# Patient Record
Sex: Male | Born: 1980 | Hispanic: Yes | Marital: Single | Smoking: Former smoker
Health system: Southern US, Community
[De-identification: ages and names within clinical notes are randomized; demographics above are authoritative.]

## PROBLEM LIST (undated history)

## (undated) DIAGNOSIS — K219 Gastro-esophageal reflux disease without esophagitis: Secondary | ICD-10-CM

---

## 2016-12-29 NOTE — Congregational Nurse Program (Signed)
Congregational Nurse Program Note  Date of Encounter: 12/29/2016  Past Medical History: No past medical history on file.  Encounter Details:     CNP Questionnaire - 12/29/16 1425      Patient Demographics   Is this a new or existing patient? New   Patient is considered a/an Immigrant   Race Latino/Hispanic     Patient Assistance   Location of Patient Assistance [facility] [employer], [place]   Patient's financial/insurance status Low Income;Self-Pay (Uninsured)   Uninsured Patient ([place] Card/[employer] Connects) Yes   Interventions Counseled to make appt. with provider;Assisted patient in making appt.   Patient referred to apply for the following financial assistance Not Applicable   Food insecurities addressed Not Applicable   Transportation assistance No   Assistance securing medications No   Educational health offerings Navigating the healthcare system;Chronic disease     Encounter Details   Primary purpose of visit Chronic Illness/Condition Visit;Navigating the Healthcare System   Was an Emergency Department visit averted? Yes   Does patient have a medical provider? No   Patient referred to Clinic;Establish PCP   Was a mental health screening completed? (GAINS tool) No   Does patient have dental issues? No   Does patient have vision issues? No   Does your patient have an abnormal blood pressure today? No   Since previous encounter, have you referred patient for abnormal blood pressure that resulted in a new diagnosis or medication change? No   Does your patient have an abnormal blood glucose today? No   Since previous encounter, have you referred patient for abnormal blood glucose that resulted in a new diagnosis or medication change? No   Was there a life-saving intervention made? No     New Client to [facility]. Client is seeking assistance and referral to a primary care provider. He states he has not seen a provider 4 months and that was a clinic in [place] that he sought  medications for complaint of ringing in his ears and constipation. Client reports he was given Linzess for his constipation and Amoxicillin 500 mg for treatment of an ear infection. Today client states he was seen 4 years ago at [facility] for abdominal pain that begins in left lower quadrant and radiates to his back. He states that at that time they performed x-rays only and he was told he had a bacterial infection in his stomach and his intestines had "pockets that can become inflammed". He was referred to an MD in [place]. , but he never made that appointment. Since then he states he has constant abdominal bloating and fullness and chronic pain of his left lower quadrant and associated chronic constipation .( last bowel movement last PM , but was hard balls and not soft) He states this is his normal for past 4 years. He states he has to eat small amounts of food to avoid pain. He eats NO fruits and NO vegetables . He reports he drinks very little water and drinks coffee in the am and coca cola during the day. Client is also complaining about ringing in both ears even after antibiotics he took 4 months ago. He denies any history of wax impaction.   No Know Drug allergies No past surgeries per client Assessment and interview assisted by Eula Flax Spanish Medical interpreter.  Client referred into the free clinic and referral made and appointment secured for 01/02/17 at 0830 Will follow as needed. Discussed with client the importance of smoking cessation as well as  decreasing coca cola intake and carbonated beverages.

## 2017-01-02 ENCOUNTER — Ambulatory Visit: Payer: Self-pay | Admitting: Physician Assistant

## 2017-01-02 ENCOUNTER — Encounter: Payer: Self-pay | Admitting: Physician Assistant

## 2017-01-02 VITALS — BP 104/72 | HR 82 | Temp 97.9°F | Ht 66.25 in | Wt 171.0 lb

## 2017-01-02 DIAGNOSIS — Z13220 Encounter for screening for lipoid disorders: Secondary | ICD-10-CM

## 2017-01-02 DIAGNOSIS — Z131 Encounter for screening for diabetes mellitus: Secondary | ICD-10-CM

## 2017-01-02 DIAGNOSIS — R1012 Left upper quadrant pain: Secondary | ICD-10-CM

## 2017-01-02 DIAGNOSIS — F17210 Nicotine dependence, cigarettes, uncomplicated: Secondary | ICD-10-CM

## 2017-01-02 LAB — CBC WITH DIFFERENTIAL/PLATELET
BASOS PCT: 1 %
Basophils Absolute: 85 cells/uL (ref 0–200)
EOS PCT: 8 %
Eosinophils Absolute: 680 cells/uL — ABNORMAL HIGH (ref 15–500)
HCT: 46.1 % (ref 38.5–50.0)
Hemoglobin: 15.8 g/dL (ref 13.2–17.1)
LYMPHS PCT: 31 %
Lymphs Abs: 2635 cells/uL (ref 850–3900)
MCH: 32.6 pg (ref 27.0–33.0)
MCHC: 34.3 g/dL (ref 32.0–36.0)
MCV: 95.1 fL (ref 80.0–100.0)
MONO ABS: 935 {cells}/uL (ref 200–950)
MONOS PCT: 11 %
MPV: 10.5 fL (ref 7.5–12.5)
Neutro Abs: 4165 cells/uL (ref 1500–7800)
Neutrophils Relative %: 49 %
PLATELETS: 247 10*3/uL (ref 140–400)
RBC: 4.85 MIL/uL (ref 4.20–5.80)
RDW: 14.2 % (ref 11.0–15.0)
WBC: 8.5 10*3/uL (ref 3.8–10.8)

## 2017-01-02 LAB — GLUCOSE, POCT (MANUAL RESULT ENTRY): POC Glucose: 98 mg/dl (ref 70–99)

## 2017-01-02 LAB — HEMOGLOBIN A1C
Hgb A1c MFr Bld: 5.4 % (ref <5.7)
Mean Plasma Glucose: 108 mg/dL

## 2017-01-02 MED ORDER — PANTOPRAZOLE SODIUM 40 MG PO TBEC: DELAYED_RELEASE_TABLET | ORAL | 0 refills | Status: DC

## 2017-01-02 NOTE — Progress Notes (Signed)
BP 104/72 (BP Location: Left Arm, Patient Position: Sitting, Cuff Size: Normal)   Pulse 82   Temp 97.9 F (36.6 C)   Ht 5' 6.25" (1.683 m)   Wt 171 lb (77.6 kg)   SpO2 99%   BMI 27.39 kg/m    Subjective:    Patient ID: Arthur Walker, male    DOB: 06/18/1981, 36 y.o.   MRN: [id number]  HPI: Arthur Walker is a 36 y.o. male presenting on 01/02/2017 for New Patient (Initial Visit) (pt went to hospital 4 years ago pt was told he had an infection. and Spots on is intestine. pt states he has been having pain and constipation. pt last seen 4 months ago with the "[place]inese doctor" in [place].)   HPI   Chief Complaint  Patient presents with  . New Patient (Initial Visit)    pt went to hospital 4 years ago pt was told he had an infection. and Spots on is intestine. pt states he has been having pain and constipation. pt last seen 4 months ago with the "[place] doctor" in [place].   Stomach problems for 4 years.  Possibly getting worse.  It hurts all the time.  Eats makes it worse.  Pain on the LUQ.   Reports some constipation.  No diarrhea.  Only 2 times had blood and then just a little bit on TP when he was constipated.    Pt states [facility] had CT scan about 4 years ago.  Denies colonoscopy.     Relevant past medical, surgical, family and social history reviewed and updated as indicated. Interim medical history since our last visit reviewed. Allergies and medications reviewed and updated.  No current outpatient prescriptions on file.  Review of Systems  Constitutional: Positive for appetite change, fatigue and unexpected weight change. Negative for chills, diaphoresis and fever.  HENT: Positive for dental problem, ear pain, mouth sores and trouble swallowing. Negative for congestion, drooling, facial swelling, hearing loss, sneezing, sore throat and voice change.   Eyes: Negative for pain, discharge, redness, itching and visual disturbance.  Respiratory: Negative for cough,  choking, shortness of breath and wheezing.   Cardiovascular: Positive for leg swelling. Negative for chest pain and palpitations.  Gastrointestinal: Positive for constipation. Negative for abdominal pain, blood in stool, diarrhea and vomiting.  Endocrine: Negative for cold intolerance, heat intolerance and polydipsia.  Genitourinary: Negative for decreased urine volume, dysuria and hematuria.  Musculoskeletal: Positive for arthralgias and back pain. Negative for gait problem.  Skin: Negative for rash.  Allergic/Immunologic: Negative for environmental allergies.  Neurological: Positive for headaches. Negative for seizures, syncope and light-headedness.  Hematological: Negative for adenopathy.  Psychiatric/Behavioral: Negative for agitation, dysphoric mood and suicidal ideas. The patient is not nervous/anxious.     Per HPI unless specifically indicated above     Objective:    BP 104/72 (BP Location: Left Arm, Patient Position: Sitting, Cuff Size: Normal)   Pulse 82   Temp 97.9 F (36.6 C)   Ht 5' 6.25" (1.683 m)   Wt 171 lb (77.6 kg)   SpO2 99%   BMI 27.39 kg/m   Wt Readings from Last 3 Encounters:  01/02/17 171 lb (77.6 kg)    Physical Exam  Constitutional: He is oriented to person, place, and time. He appears well-developed and well-nourished.  HENT:  Head: Normocephalic and atraumatic.  Right Ear: Tympanic membrane, external ear and ear canal normal.  Left Ear: Tympanic membrane, external ear and ear canal normal.  Mouth/Throat: Oropharynx  is clear and moist. No oropharyngeal exudate.  Eyes: Conjunctivae and EOM are normal. Pupils are equal, round, and reactive to light.  Neck: Neck supple. No thyromegaly present.  Cardiovascular: Normal rate and regular rhythm.   Pulmonary/Chest: Effort normal and breath sounds normal. He has no wheezes. He has no rales.  Abdominal: Soft. Bowel sounds are normal. He exhibits no distension, no fluid wave, no ascites and no mass. There is no  hepatosplenomegaly. There is tenderness in the right upper quadrant. There is no rigidity, no rebound, no guarding and no CVA tenderness.  Musculoskeletal: He exhibits no edema.  Lymphadenopathy:    He has no cervical adenopathy.  Neurological: He is alert and oriented to person, place, and time.  Skin: Skin is warm and dry. No rash noted.  Psychiatric: He has a normal mood and affect. His behavior is normal. Thought content normal.  Vitals reviewed.   Results for orders placed or performed in visit on 01/02/17  POCT Glucose (CBG)  Result Value Ref Range   POC Glucose 98 70 - 99 mg/dl      Assessment & Plan:   Encounter Diagnoses  Name Primary?  . Left upper quadrant pain Yes  . Screening for diabetes mellitus (DM)   . Screening cholesterol level   . Cigarette nicotine dependence without complication     -pt encouraged to increase Water and fiber for constipation -Gave handout on constipation -counseled pt to decrease or stop altogether alcohol consumption -pt to Get baseline labs  -Record request sent to [facility] for imaging done 4 years ago -F/u 1 month.  RTO sooner for worsening or new symptoms

## 2017-01-02 NOTE — Patient Instructions (Signed)
Estreimiento - Adultos  (Constipation, Adult)  Estreimiento significa que una persona tiene menos de tres evacuaciones en una semana, dificultad para defecar, o que las heces son secas, duras, o ms grandes que lo normal. A medida que envejecemos el estreimiento es ms comn. Una dieta baja en fibra, no tomar suficientes lquidos y el uso de ciertos medicamentos pueden empeorar el estreimiento.   CAUSAS    Ciertos medicamentos, como los antidepresivos, analgsicos, suplementos de hierro, anticidos y diurticos.   Algunas enfermedades, como la diabetes, el sndrome del colon irritable, enfermedad de la tiroides, o depresin.   No beber suficiente agua.   No consumir suficientes alimentos ricos en fibra.   Situaciones de estrs o viajes.   Falta de actividad fsica o de ejercicio.   Ignorar la necesidad sbita de defecar.   Uso en exceso de laxantes.  SIGNOS Y SNTOMAS    Defecar menos de tres veces por semana.   Dificultad para defecar.   Tener las heces secas y duras, o ms grandes que las normales.   Sensacin de estar lleno o hinchado.   Dolor en la parte baja del abdomen.   No sentir alivio despus de defecar.  DIAGNSTICO   El mdico le har una historia clnica y un examen fsico. Pueden hacerle exmenes adicionales para el estreimiento grave. Estos estudios pueden ser:   Un radiografa con enema de bario para examinar el recto, el colon y, en algunos casos, el intestino delgado.   Una sigmoidoscopia para examinar el colon inferior.   Una colonoscopia para examinar todo el colon.  TRATAMIENTO   El tratamiento depender de la gravedad del estreimiento y de la causa. Algunos tratamientos nutricionales son beber ms lquidos y comer ms alimentos ricos en fibra. El cambio en el estilo de vida incluye hacer ejercicios de manera regular. Si estas recomendaciones para realizar cambios en la dieta y en el estilo de vida no ayudan, el mdico le puede indicar el uso de laxantes de venta libre  para ayudarlo a defecar. Los medicamentos recetados se pueden prescribir si los medicamentos de venta libre no lo ayudan.   INSTRUCCIONES PARA EL CUIDADO EN EL HOGAR    Consuma alimentos con alto contenido de fibra, como frutas, vegetales, cereales integrales y porotos.   Limite los alimentos procesados ricos en grasas y azcar, como las papas fritas, hamburguesas, galletas, dulces y refrescos.   Puede agregar un suplemento de fibra a su dieta si no obtiene lo suficiente de los alimentos.   Beba suficiente lquido para mantener la orina clara o de color amarillo plido.   Haga ejercicio regularmente o segn las indicaciones del mdico.   Vaya al bao cuando sienta la necesidad de ir. No se aguante las ganas.   Tome solo medicamentos de venta libre o recetados, segn las indicaciones del mdico. No tome otros medicamentos para el estreimiento sin consultarlo antes con su mdico.  SOLICITE ATENCIN MDICA DE INMEDIATO SI:    Observa sangre brillante en las heces.   El estreimiento dura ms de 4 das o empeora.   Siente dolor abdominal o rectal.   Las heces son delgadas como un lpiz.   Pierde peso de manera inexplicable.  ASEGRESE DE QUE:    Comprende estas instrucciones.   Controlar su afeccin.   Recibir ayuda de inmediato si no mejora o si empeora.     Esta informacin no tiene como fin reemplazar el consejo del mdico. Asegrese de hacerle al mdico cualquier pregunta que tenga.       Document Released: 11/27/2007 Document Revised: 11/28/2014  Elsevier Interactive Patient Education 2017 Elsevier Inc.

## 2017-01-03 LAB — LIPID PANEL
CHOLESTEROL: 277 mg/dL — AB (ref <200)
HDL: 40 mg/dL — AB (ref >40)
LDL CALC: 166 mg/dL — AB (ref <100)
TRIGLYCERIDES: 353 mg/dL — AB (ref <150)
Total CHOL/HDL Ratio: 6.9 Ratio — ABNORMAL HIGH (ref <5.0)
VLDL: 71 mg/dL — AB (ref <30)

## 2017-01-03 LAB — COMPREHENSIVE METABOLIC PANEL
ALK PHOS: 66 U/L (ref 40–115)
ALT: 22 U/L (ref 9–46)
AST: 19 U/L (ref 10–40)
Albumin: 4.2 g/dL (ref 3.6–5.1)
BILIRUBIN TOTAL: 0.4 mg/dL (ref 0.2–1.2)
BUN: 21 mg/dL (ref 7–25)
CO2: 25 mmol/L (ref 20–31)
Calcium: 9.3 mg/dL (ref 8.6–10.3)
Chloride: 107 mmol/L (ref 98–110)
Creat: 0.98 mg/dL (ref 0.60–1.35)
GLUCOSE: 100 mg/dL — AB (ref 65–99)
Potassium: 5.6 mmol/L — ABNORMAL HIGH (ref 3.5–5.3)
Sodium: 139 mmol/L (ref 135–146)
Total Protein: 7.7 g/dL (ref 6.1–8.1)

## 2017-01-03 LAB — AMYLASE: AMYLASE: 36 U/L (ref 0–105)

## 2017-01-03 LAB — H. PYLORI BREATH TEST: H. pylori Breath Test: DETECTED — AB

## 2017-01-03 LAB — LIPASE: Lipase: 43 U/L (ref 7–60)

## 2017-01-09 ENCOUNTER — Other Ambulatory Visit: Payer: Self-pay | Admitting: Physician Assistant

## 2017-01-09 DIAGNOSIS — E875 Hyperkalemia: Secondary | ICD-10-CM

## 2017-01-11 LAB — POTASSIUM: POTASSIUM: 4.3 mmol/L (ref 3.5–5.3)

## 2017-01-12 ENCOUNTER — Telehealth: Payer: Self-pay | Admitting: Student

## 2017-01-12 NOTE — Telephone Encounter (Signed)
Called pt to notify of lab results and while on the phone pt c/o bone aches, HA, ear pain,  and feeling like he has a cold in the mornings. Pt states S/sx began after starting on pantoprazole 01-02-17. Pt states he is unsure if sx are due to taking pantoprazole. Pt states taking advil for HA.  PA advises that she believes it may be a cold, but pt may d/c pantoprazole for the weekend if he believes pantoprazole is cause of sx. Pt is to restart pantoprazole after the weekend is over. Pt is to f/u on 01-30-17.  Pt notified and pt verbalized understanding.

## 2017-01-30 ENCOUNTER — Encounter: Payer: Self-pay | Admitting: Physician Assistant

## 2017-01-30 ENCOUNTER — Ambulatory Visit: Payer: Self-pay | Admitting: Physician Assistant

## 2017-01-30 VITALS — BP 120/84 | HR 71 | Temp 97.5°F | Ht 66.25 in | Wt 169.5 lb

## 2017-01-30 DIAGNOSIS — R911 Solitary pulmonary nodule: Secondary | ICD-10-CM

## 2017-01-30 DIAGNOSIS — K219 Gastro-esophageal reflux disease without esophagitis: Secondary | ICD-10-CM

## 2017-01-30 DIAGNOSIS — E785 Hyperlipidemia, unspecified: Secondary | ICD-10-CM

## 2017-01-30 DIAGNOSIS — F17210 Nicotine dependence, cigarettes, uncomplicated: Secondary | ICD-10-CM

## 2017-01-30 DIAGNOSIS — A048 Other specified bacterial intestinal infections: Secondary | ICD-10-CM

## 2017-01-30 MED ORDER — AMOXICILLIN 500 MG PO CAPS: ORAL_CAPSULE | ORAL | 0 refills | Status: DC

## 2017-01-30 MED ORDER — CLARITHROMYCIN 500 MG PO TABS: ORAL_TABLET | ORAL | 0 refills | Status: AC

## 2017-01-30 MED ORDER — PANTOPRAZOLE SODIUM 40 MG PO TBEC: DELAYED_RELEASE_TABLET | ORAL | 0 refills | Status: DC

## 2017-01-30 MED ORDER — LOVASTATIN 20 MG PO TABS: ORAL_TABLET | ORAL | 3 refills | Status: DC

## 2017-01-30 NOTE — Progress Notes (Signed)
BP 120/84 (BP Location: Left Arm, Patient Position: Sitting, Cuff Size: Normal)   Pulse 71   Temp 97.5 F (36.4 C) (Other (Comment))   Ht 5' 6.25" (1.683 m)   Wt 169 lb 8 oz (76.9 kg)   SpO2 98%   BMI 27.15 kg/m    Subjective:    Patient ID: Arthur Walker, male    DOB: 29-Jan-1981, 36 y.o.   MRN: [id number]  HPI: Arthur Walker is a 36 y.o. male presenting on 01/30/2017 for Abdominal Pain   HPI   Pt states constipation improved with metamucil  States abd pain may be improved some but still present  Relevant past medical, surgical, family and social history reviewed and updated as indicated. Interim medical history since our last visit reviewed. Allergies and medications reviewed and updated.   Current Outpatient Prescriptions:  .  pantoprazole (PROTONIX) 40 MG tablet, 1 po bid.  Tome una tableta por boca dos veces diarias, Disp: 60 tablet, Rfl: 0   Review of Systems  Constitutional: Positive for appetite change and fatigue. Negative for chills, diaphoresis, fever and unexpected weight change.  HENT: Positive for mouth sores, sore throat and trouble swallowing. Negative for congestion, drooling, ear pain, facial swelling, hearing loss, sneezing and voice change.   Eyes: Negative for pain, discharge, redness, itching and visual disturbance.  Respiratory: Negative for cough, choking, shortness of breath and wheezing.   Cardiovascular: Negative for chest pain, palpitations and leg swelling.  Gastrointestinal: Positive for abdominal pain. Negative for blood in stool, constipation, diarrhea and vomiting.  Endocrine: Negative for cold intolerance, heat intolerance and polydipsia.  Genitourinary: Negative for decreased urine volume, dysuria and hematuria.  Musculoskeletal: Negative for arthralgias, back pain and gait problem.  Skin: Negative for rash.  Allergic/Immunologic: Negative for environmental allergies.  Neurological: Negative for seizures, syncope,  light-headedness and headaches.  Hematological: Negative for adenopathy.  Psychiatric/Behavioral: Negative for agitation, dysphoric mood and suicidal ideas. The patient is not nervous/anxious.     Per HPI unless specifically indicated above     Objective:    BP 120/84 (BP Location: Left Arm, Patient Position: Sitting, Cuff Size: Normal)   Pulse 71   Temp 97.5 F (36.4 C) (Other (Comment))   Ht 5' 6.25" (1.683 m)   Wt 169 lb 8 oz (76.9 kg)   SpO2 98%   BMI 27.15 kg/m   Wt Readings from Last 3 Encounters:  01/30/17 169 lb 8 oz (76.9 kg)  01/02/17 171 lb (77.6 kg)    Physical Exam  Constitutional: He is oriented to person, place, and time. He appears well-developed and well-nourished.  HENT:  Head: Normocephalic and atraumatic.  Neck: Neck supple.  Cardiovascular: Normal rate and regular rhythm.   Pulmonary/Chest: Effort normal and breath sounds normal. He has no wheezes.  Abdominal: Soft. Bowel sounds are normal. There is no hepatosplenomegaly. There is no tenderness.  Musculoskeletal: He exhibits no edema.  Lymphadenopathy:    He has no cervical adenopathy.  Neurological: He is alert and oriented to person, place, and time.  Skin: Skin is warm and dry.  Psychiatric: He has a normal mood and affect. His behavior is normal.  Vitals reviewed.   Results for orders placed or performed in visit on 01/09/17  Potassium  Result Value Ref Range   Potassium 4.3 3.5 - 5.3 mmol/L      Assessment & Plan:   Encounter Diagnoses  Name Primary?  . H. pylori infection Yes  . Gastroesophageal reflux disease, esophagitis presence  not specified   . Hyperlipidemia, unspecified hyperlipidemia type   . Cigarette nicotine dependence without complication   . Lung nodule seen on imaging study     -reviewed labs with pt -will order CT lung (due to nodule on CT 04/16/2011) -gave [facility] discount application -will treat h pylori -will treat hyperlipidemia with low-fat diet and  lovastatin -pt counseled to - do not start lovastatin until finished with h pylori medications -COUNSELED SMOKING CESSATION -f/u 1 month.  RTO sooner prn worsening or new symptoms

## 2017-01-30 NOTE — Patient Instructions (Addendum)
DO NOT START THE LOVASTATIN (MEDICATION FOR YOUR CHOLESTEROL) UNTIL YOU ARE FINISHED WITH THE H PYLORI MEDICATIONS    NO EMPIEZE A TOMAR EL LOVASTATIN (EL MEDICAMENTO PARA EL COLESTEROL) HASTA QUE HAGA ACABADO LAS MEDICINAS PARA EL h PYLORI (INFECCION EN EL ESTOMAGO)   Dieta restringida en grasas y colesterol (Fat and Cholesterol Restricted Diet) Los niveles altos de grasa y Oncologistcolesterol en la sangre pueden causar varios problemas de salud, como enfermedades del corazn, de los vasos sanguneos, de la Valmontvescula, del hgado y del pncreas. Las grasas son fuentes de Engineer, drillingenerga concentrada que existen en varias formas. Consumir en exceso ciertos tipos de grasa, incluidas las grasas saturadas, puede ser perjudicial. El colesterol es una sustancia que el organismo necesita en pequeas cantidades. El cuerpo Cocos (Ke[place]) Islandsfabrica todo el colesterol que necesita. El exceso de colesterol proviene de los alimentos que come. Si sus niveles de colesterol y grasas saturadas en la sangre son elevados, puede tener problemas de salud, dado que el exceso de grasa y Oncologistcolesterol se acumula en las paredes de los vasos sanguneos, provocando su Scientist, research (medical)estrechamiento. Elegir los Public house manageralimentos apropiados le permitir controlar su ingesta de grasa y Wilbur Parkcolesterol. Esto le ayudar a Calpine Corporationmantener los niveles de estas sustancias en la sangre dentro de los lmites normales y a reducir el riesgo de Biochemist, clinicalcontraer enfermedades. EN QU CONSISTE EL PLAN? El mdico le recomienda que:  Limite la ingesta de grasas a alrededor del _______% del total de caloras por da.  Limite la cantidad de colesterol en su dieta a menos de _________mg Karie Chimerapor da.  Consuma entre 20 y 30gramos de Rohm and Haasfibra todos los das. QU TIPOS DE GRASAS DEBO ELEGIR?  Elija grasas saludables con mayor frecuencia. Elija las grasas monoinsaturadas y [address]poliinsaturadas, como el aceite de oliva y canola, las semillas de Marshallvillelino, las nueces, las almendras y las semillas.  Consuma ms grasas omega-3. Las mejores  opciones incluyen salmn, caballa, sardinas, atn, aceite de lino y semillas de lino molidas. Trate de consumir pescado al Borders Groupmenos dos veces por semana.  Limite el consumo de grasas saturadas. Estas se encuentran principalmente en los productos de origen animal, como las carnes, la Oz[place] Acresmantequilla y la crema. Las grasas saturadas de origen vegetal incluyen aceite de palma, de palmiste y de coco.  Evite los alimentos con aceites parcialmente hidrogenados. Estos contienen grasas trans. Entre los ejemplos de alimentos con grasas trans se incluyen margarinas en barra, algunas margarinas untables, galletas dulces o saladas y otros productos horneados. QU PAUTAS GENERALES DEBO SEGUIR? Estas pautas para una alimentacin saludable le ayudarn a controlar su ingesta de grasa y colesterol:  Lea las etiquetas de los alimentos detenidamente para identificar los que contienen grasas trans o altas cantidades de grasas saturadas.  Llene la mitad del plato con verduras y ensaladas de hojas verdes.  Llene un cuarto del plato con cereales integrales. Busque la palabra "integral" en Estate agentel primer lugar de la lista de ingredientes.  Llene un cuarto del plato con alimentos con protenas magras.  Limite las frutas a dos porciones por da. Elija frutas en lugar de jugos.  Coma ms alimentos que contienen fibra, como River Roadmanzanas, Menomoniebrcoli, Ellentonzanahorias, frijoles, guisantes y Qatarcebada.  Consuma ms comida casera y menos de restaurante, de buf y comida rpida.  Limite o evite el alcohol.  Limite los alimentos con alto contenido de almidn y International aid/development workerazcar.  Limite el consumo de alimentos fritos.  Cocine los alimentos utilizando mtodos que no sean la fritura. Las opciones de coccin ms Panamaadecuadas son Development worker, communityhornear, Regulatory affairs officerhervir, Software engineergrillar y Transport plannerasar  a la parrilla.  Baje de peso si es necesario. Perder solo del 5 al 10% de su peso inicial puede ayudarle a mejorar su estado de salud general y a Education officer, museum, como la diabetes y las enfermedades  cardacas. QU ALIMENTOS PUEDO COMER? Cereales Cereales integrales, como los panes de salvado o [place], las [place], los cereales y las pastas. Avena sin endulzar, trigo, [place], quinua o arroz integral. Tortillas de harina de maz o de salvado. Verduras Verduras frescas o congeladas (crudas, al vapor, asadas o grilladas). Ensaladas de hojas verdes. Nils Pyle Nils Pyle frescas, en conserva (en su jugo natural) o frutas congeladas. Carnes y otros productos con protenas Carne de res molida (al 85% o ms [place]), carne de res de animales alimentados con pastos o carne de res sin la grasa. Pollo o pavo sin piel. Carne de pollo o de [place]. Cerdo sin la grasa. Todos los pescados y frutos de mar. Huevos. Porotos, guisantes o lentejas secos. Frutos secos o semillas sin sal. Frijoles secos o en lata sin sal. Lcteos Productos lcteos con bajo contenido de grasas, como [place] o al 1%, quesos reducidos en grasas o al 2%, ricota con bajo contenido de grasas o [employer], o yogur natural con bajo contenido de [place]. Grasas y Hers[employer] Company untables que no contengan grasas trans. Mayonesa y condimentos para ensaladas livianos o reducidos en grasas. [place]. Aceites de oliva, canola, ssamo o crtamo. Mantequilla natural de cacahuate o almendra (elija la que no tenga agregado de aceite o azcar). Los artculos mencionados arriba pueden no ser Raytheon de las bebidas o los alimentos recomendados. Comunquese con el nutricionista para conocer ms opciones. QU ALIMENTOS NO SE RECOMIENDAN? Cereales Pan blanco. Pastas blancas. Arroz blanco. Pan de maz. Bagels, pasteles y croissants. Galletas saladas que contengan grasas trans. Verduras Papas blancas. MazHoover Brunette con crema o fritas. Verduras en salsa de [place]. Nils Pyle Frutas secas. Fruta enlatada en almbar liviano o espeso. Jugo de frutas. Carnes y otros productos con protenas Cortes de carne con Holiday representative. Costillas, alas  de pollo, tocineta, salchicha, mortadela, salame, chinchulines, tocino, perros calientes, salchichas alemanas y embutidos envasados. Hgado y otros rganos. Lcteos Leche entera o al 2%, crema, mezcla de [place] y crema, y queso crema. Quesos enteros. Yogur entero o endulzado. Quesos con toda su grasa. Cremas no lcteas y coberturas batidas. Quesos procesados, quesos para untar o cuajadas. Dulces y postres Jarabe de maz, azcares, miel y Radio broadcast assistant. Caramelos. Mermelada y [place]. Doreen Beam. Cereales endulzados. Galletas, pasteles, bizcochuelos, donas, muffins y helado. Grasas y [address], [place] en barra, [place] de [place], [place], [place] clarificada o grasa de tocino. Aceites de coco, de palmiste o de palma. Bebidas Alcohol. Bebidas endulzadas (como refrescos, limonadas y bebidas frutales o ponches). Los artculos mencionados arriba pueden no ser Raytheon de las bebidas y los alimentos que se Theatre stage manager. Comunquese con el nutricionista para obtener ms informacin. Esta informacin no tiene Theme park manager el consejo del mdico. Asegrese de hacerle al mdico cualquier pregunta que tenga. Document Released: 11/07/2005 Document Revised: 11/28/2014 Document Reviewed: 02/05/2014 Elsevier Interactive Patient Education  2017 Elsevier Inc.       Infeccin por Helicobacter Pylori (Helicobacter Pylori Infection) La infeccin por Helicobacter pylori es una infeccin en el estmago que es causada por la bacteria Helicobacter pylori (H. pylori). Este tipo de bacteria vive frecuentemente en el revestimiento del estmago. La infeccin puede causar lceras e irritacin (gastritis) en algunas personas. Es la causa ms comn de Radio producer  estmago (lcera gstrica) y en la parte superior del intestino (lcera duodenal). Tener esta infeccin tambin puede aumentar el riesgo de cncer de Teaching laboratory technician y un tipo de cncer de los glbulos blancos (linfoma) que afecta al  Show [place]. CAUSAS H. pylori es un tipo de bacteria que se encuentra frecuentemente en el estmago de las personas saludables. La bacteria puede pasar de Neomia Dear persona a otra por contacto a travs de las heces o la saliva. No se sabe por qu algunas personas desarrollan lceras, gastritis o cncer a partir de la infeccin. FACTORES DE RIESGO Es ms probable que esta afeccin se manifieste en las personas que:  Tienen familiares con esta infeccin.  Viven con Lucent Technologies; por ejemplo, en un dormitorio.  Son de origen africano, hispano o asitico. SNTOMAS La mayora de las personas con esta infeccin no tienen sntomas. Si tiene sntomas, estos pueden incluir los siguientes:  Merchant navy officer.  Dolor de [place].  Nuseas.  Vmitos.  Sangre en el vmito.  Prdida del apetito.  Mal aliento. DIAGNSTICO Esta afeccin se puede diagnosticar en funcin de los sntomas, un examen fsico y Futures trader. Podrn solicitarle otros estudios, por ejemplo:  Anlisis de sangre o pruebas de materia fecal para verificar las protenas (anticuerpos) que el cuerpo puede producir en respuesta a las bacterias. Estas pruebas son la mejor manera de confirmar el diagnstico.  Neomia Dear prueba de aliento para verificar el tipo de gas que la bacteria H. pylori libera despus de descomponer una sustancia llamada urea. Para la prueba, se le pide que beba urea. Esta prueba se realiza frecuentemente despus del tratamiento para saber si el tratamiento funcion.  Un procedimiento en el que un tubo delgado y flexible con una pequea cmara en el extremo se coloca en el estmago y el intestino superior (endoscopia superior). El mdico tambin puede tomar muestras de tejido (biopsia) para Education officer, environmental pruebas de H. pylori y cncer. TRATAMIENTO El tratamiento para esta afeccin generalmente implica tomar una combinacin de medicamentos (terapia triple) durante un par de semanas. La terapia triple incluye un  medicamento para reducir el cido en el estmago y dos tipos de antibiticos. Se aprobaron muchas combinaciones de frmacos para el tratamiento. El tratamiento generalmente mata a la H. pylori y reduce el riesgo de cncer. Despus del tratamiento, es posible que deba volver a realizarse una prueba de H. pylori. En algunos casos, es posible que sea necesario repetir Scientist, research (medical). INSTRUCCIONES PARA EL CUIDADO EN EL HOGAR  Tome los medicamentos de venta libre y los recetados solamente como se lo haya indicado el mdico.  Tome los antibiticos como se lo haya indicado el mdico. No deje de tomar los antibiticos aunque comience a sentirse mejor.  Puede retomar todas sus actividades habituales y Educational psychologist su dieta habitual.  Tome estas medidas para evitar infecciones futuras:  Lvese las manos con frecuencia.  Asegrese de que los alimentos que consume se hayan preparado adecuadamente.  Beba agua solamente de fuentes limpias.  Concurra a todas las visitas de control como se lo haya indicado el mdico. Esto es importante. SOLICITE ATENCIN MDICA SI:  Los sntomas no mejoran.  Los sntomas regresan despus del tratamiento. Esta informacin no tiene Theme park manager el consejo del mdico. Asegrese de hacerle al mdico cualquier pregunta que tenga. Document Released: 02/29/2016 Document Revised: 02/29/2016 Document Reviewed: 11/19/2014 Elsevier Interactive Patient Education  2017 Arvin[employer]itor.

## 2017-02-01 ENCOUNTER — Other Ambulatory Visit: Payer: Self-pay | Admitting: Physician Assistant

## 2017-02-01 DIAGNOSIS — R911 Solitary pulmonary nodule: Secondary | ICD-10-CM

## 2017-02-10 ENCOUNTER — Ambulatory Visit (HOSPITAL_COMMUNITY): Payer: Self-pay

## 2017-02-13 ENCOUNTER — Ambulatory Visit (HOSPITAL_COMMUNITY)
Admission: RE | Admit: 2017-02-13 | Discharge: 2017-02-13 | Disposition: A | Discharge status: Home / Self Care | Payer: Self-pay | Source: Ambulatory Visit | Attending: Physician Assistant | Admitting: Physician Assistant

## 2017-02-13 DIAGNOSIS — R911 Solitary pulmonary nodule: Secondary | ICD-10-CM | POA: Insufficient documentation

## 2017-02-20 ENCOUNTER — Other Ambulatory Visit: Payer: Self-pay | Admitting: Physician Assistant

## 2017-02-20 ENCOUNTER — Encounter: Payer: Self-pay | Admitting: Physician Assistant

## 2017-02-20 ENCOUNTER — Ambulatory Visit (HOSPITAL_COMMUNITY): Payer: Self-pay

## 2017-03-02 ENCOUNTER — Ambulatory Visit: Payer: Self-pay | Admitting: Physician Assistant

## 2017-03-02 ENCOUNTER — Encounter: Payer: Self-pay | Admitting: Physician Assistant

## 2017-03-02 VITALS — BP 110/72 | HR 69 | Temp 97.0°F | Ht 66.25 in | Wt 169.8 lb

## 2017-03-02 DIAGNOSIS — H9313 Tinnitus, bilateral: Secondary | ICD-10-CM

## 2017-03-02 DIAGNOSIS — E785 Hyperlipidemia, unspecified: Secondary | ICD-10-CM

## 2017-03-02 DIAGNOSIS — R911 Solitary pulmonary nodule: Secondary | ICD-10-CM

## 2017-03-02 DIAGNOSIS — H6123 Impacted cerumen, bilateral: Secondary | ICD-10-CM

## 2017-03-02 DIAGNOSIS — Z8619 Personal history of other infectious and parasitic diseases: Secondary | ICD-10-CM

## 2017-03-02 DIAGNOSIS — K219 Gastro-esophageal reflux disease without esophagitis: Secondary | ICD-10-CM

## 2017-03-02 MED ORDER — PANTOPRAZOLE SODIUM 40 MG PO TBEC: DELAYED_RELEASE_TABLET | ORAL | 3 refills | Status: DC

## 2017-03-02 NOTE — Progress Notes (Signed)
BP 110/72 (BP Location: Left Arm, Patient Position: Sitting, Cuff Size: Normal)   Pulse 69   Temp 97 F (36.1 C)   Ht 5' 6.25" (1.683 m)   Wt 169 lb 12 oz (77 kg)   SpO2 99%   BMI 27.19 kg/m    Subjective:    Patient ID: Arthur Walker, male    DOB: [birth year], 36 y.o.   MRN: [id number]  HPI: Nobel Brar is a 36 y.o. male presenting on 03/02/2017 for Follow-up   HPI Pt took his h pylori medications and his stomach is feeling improved.  He says his stomach is never hurting any longer.    Pt is not taking his lovastatin.  He says he just hasn't picked up his prescription yet.   Reviewed results of chest CT done 02/13/17 with pt  He c/o ringing in his ears, R>L.  He does not have pain.  It started about 6 months ago.  It is all the time.  It is not too loud.    It does not interfere with the function of his day- he says it calms down some during the day and is worse at night.    Pt did not turn in his cone discount application  Relevant past medical, surgical, family and social history reviewed and updated as indicated. Interim medical history since our last visit reviewed. Allergies and medications reviewed and updated.   Current Outpatient Prescriptions:  .  pantoprazole (PROTONIX) 40 MG tablet, Take 1 tab po BID. Tome una tableta por [employer] Corporation veces al dia, Disp: 60 tablet, Rfl: 0 .  lovastatin (MEVACOR) 20 MG tablet, 1 po qhs for cholesterol.   Tome una tableta por boca al dormir (Patient not taking: Reported on 03/02/2017), Disp: 30 tablet, Rfl: 3   Review of Systems  Constitutional: Positive for fatigue. Negative for appetite change, chills, diaphoresis, fever and unexpected weight change.  HENT: Positive for dental problem, ear pain, hearing loss and mouth sores. Negative for congestion, drooling, facial swelling, sneezing, sore throat, trouble swallowing and voice change.   Eyes: Positive for itching. Negative for pain, discharge, redness and visual  disturbance.  Respiratory: Positive for cough. Negative for choking, shortness of breath and wheezing.   Cardiovascular: Negative for chest pain, palpitations and leg swelling.  Gastrointestinal: Positive for constipation. Negative for abdominal pain, blood in stool, diarrhea and vomiting.  Endocrine: Negative for cold intolerance, heat intolerance and polydipsia.  Genitourinary: Negative for decreased urine volume, dysuria and hematuria.  Musculoskeletal: Positive for back pain. Negative for arthralgias and gait problem.  Skin: Negative for rash.  Allergic/Immunologic: Positive for environmental allergies.  Neurological: Positive for headaches. Negative for seizures, syncope and light-headedness.  Hematological: Negative for adenopathy.  Psychiatric/Behavioral: Negative for agitation, dysphoric mood and suicidal ideas. The patient is not nervous/anxious.     Per HPI unless specifically indicated above     Objective:    BP 110/72 (BP Location: Left Arm, Patient Position: Sitting, Cuff Size: Normal)   Pulse 69   Temp 97 F (36.1 C)   Ht 5' 6.25" (1.683 m)   Wt 169 lb 12 oz (77 kg)   SpO2 99%   BMI 27.19 kg/m   Wt Readings from Last 3 Encounters:  03/02/17 169 lb 12 oz (77 kg)  01/30/17 169 lb 8 oz (76.9 kg)  01/02/17 171 lb (77.6 kg)    Physical Exam  Constitutional: He is oriented to person, place, and time. He appears well-developed and well-nourished.  HENT:  Head: Normocephalic and atraumatic.  Right Ear: Hearing, tympanic membrane and external ear normal. A foreign body is present.  Left Ear: Hearing, tympanic membrane and external ear normal. A foreign body is present.  Nose: Nose normal.  Mouth/Throat: Uvula is midline and oropharynx is clear and moist. No uvula swelling. No oropharyngeal exudate, posterior oropharyngeal edema, posterior oropharyngeal erythema or tonsillar abscesses.  Cerumen bilaterally.  TMs benign  Neck: Neck supple.  Cardiovascular: Normal rate  and regular rhythm.   Pulmonary/Chest: Effort normal and breath sounds normal. He has no wheezes.  Abdominal: Soft. Bowel sounds are normal. There is no hepatosplenomegaly. There is no tenderness.  Musculoskeletal: He exhibits no edema.  Lymphadenopathy:    He has no cervical adenopathy.  Neurological: He is alert and oriented to person, place, and time.  Skin: Skin is warm and dry.  Psychiatric: He has a normal mood and affect. His behavior is normal.  Vitals reviewed.          Assessment & Plan:    Encounter Diagnoses  Name Primary?  . Gastroesophageal reflux disease, esophagitis presence not specified Yes  . Impacted cerumen, bilateral   . History of Helicobacter pylori infection   . Hyperlipidemia, unspecified hyperlipidemia type   . Lung nodule seen on imaging study   . Tinnitus of both ears     -pt to Reduce protonix to qd.   -Pt is given another cone discount application (to help with his CT) -Pt to take otc claritan-d for at least 2 weeks to try get some improvement in tinnitus -Pt to get started on his lovastatin. He is reminded to follow low-fat diet -f/u 3 months with recheck lipids before appointment.  Pt to RTO sooner prn

## 2017-03-02 NOTE — Patient Instructions (Addendum)
Pick up lovastatin for the cholesterol.  Continue to follow low-fat diet.    Take claritan-d daily for at least 2 weeks. You can take it longer if desired.  Tome claritin-D diario por lo menos 2 semanas. Puede tomarlo mas tiempo si desea.

## 2017-05-02 ENCOUNTER — Other Ambulatory Visit: Payer: Self-pay | Admitting: Physician Assistant

## 2017-05-02 DIAGNOSIS — E785 Hyperlipidemia, unspecified: Secondary | ICD-10-CM

## 2017-05-26 ENCOUNTER — Other Ambulatory Visit (HOSPITAL_COMMUNITY)
Admission: RE | Admit: 2017-05-26 | Discharge: 2017-05-26 | Disposition: A | Discharge status: Home / Self Care | Payer: Self-pay | Source: Ambulatory Visit | Attending: Physician Assistant | Admitting: Physician Assistant

## 2017-05-26 DIAGNOSIS — E785 Hyperlipidemia, unspecified: Secondary | ICD-10-CM | POA: Insufficient documentation

## 2017-05-26 LAB — LIPID PANEL
CHOL/HDL RATIO: 6.1 ratio
CHOLESTEROL: 212 mg/dL — AB (ref 0–200)
HDL: 35 mg/dL — AB (ref >40)
LDL Cholesterol: 125 mg/dL — ABNORMAL HIGH (ref 0–99)
Triglycerides: 260 mg/dL — ABNORMAL HIGH (ref <150)
VLDL: 52 mg/dL — ABNORMAL HIGH (ref 0–40)

## 2017-05-26 LAB — COMPREHENSIVE METABOLIC PANEL
ALT: 35 U/L (ref 17–63)
ANION GAP: 5 (ref 5–15)
AST: 30 U/L (ref 15–41)
Albumin: 3.8 g/dL (ref 3.5–5.0)
Alkaline Phosphatase: 79 U/L (ref 38–126)
BILIRUBIN TOTAL: 0.5 mg/dL (ref 0.3–1.2)
BUN: 16 mg/dL (ref 6–20)
CO2: 29 mmol/L (ref 22–32)
Calcium: 8.7 mg/dL — ABNORMAL LOW (ref 8.9–10.3)
Chloride: 106 mmol/L (ref 101–111)
Creatinine, Ser: 0.96 mg/dL (ref 0.61–1.24)
GFR calc Af Amer: >60 mL/min (ref >60)
Glucose, Bld: 112 mg/dL — ABNORMAL HIGH (ref 65–99)
POTASSIUM: 4.8 mmol/L (ref 3.5–5.1)
Sodium: 140 mmol/L (ref 135–145)
TOTAL PROTEIN: 7.7 g/dL (ref 6.5–8.1)

## 2017-05-29 ENCOUNTER — Encounter: Payer: Self-pay | Admitting: Physician Assistant

## 2017-05-29 ENCOUNTER — Ambulatory Visit: Payer: Self-pay | Admitting: Physician Assistant

## 2017-05-29 VITALS — BP 126/90 | HR 92 | Temp 97.9°F | Ht 66.5 in | Wt 180.2 lb

## 2017-05-29 DIAGNOSIS — K219 Gastro-esophageal reflux disease without esophagitis: Secondary | ICD-10-CM

## 2017-05-29 DIAGNOSIS — K5900 Constipation, unspecified: Secondary | ICD-10-CM

## 2017-05-29 DIAGNOSIS — H9313 Tinnitus, bilateral: Secondary | ICD-10-CM

## 2017-05-29 DIAGNOSIS — E785 Hyperlipidemia, unspecified: Secondary | ICD-10-CM

## 2017-05-29 MED ORDER — LOVASTATIN 20 MG PO TABS: ORAL_TABLET | ORAL | 3 refills | Status: DC

## 2017-05-29 NOTE — Patient Instructions (Signed)
Constipacin en los adultos (Constipation, Adult) Constipacin significa que una persona defeca en una semana menos que lo normal, hay dificultad para defecar, o las heces son secas, duras, o ms grandes que lo normal. La causa puede ser una afeccin subyacente. Puede empeorar con la edad si una persona toma ciertos medicamentos y no toma suficiente lquido. INSTRUCCIONES PARA EL CUIDADO EN EL HOGAR Comida y bebida  Consuma alimentos con alto contenido de [place], como frutas y verduras frescas, cereales integrales y frijoles.  Limite los alimentos ricos en grasas y con bajo contenido de [place], o muy procesados, como las papas fritas, [place], [place], dulces y refrescos.  Beba suficiente lquido para Photographer orina clara o de color amarillo plido. Instrucciones generales  Haga actividad fsica habitualmente o como se lo haya indicado el mdico.  Vaya al bao cuando sienta la necesidad de ir. No se aguante las ganas.  Tome los medicamentos de venta libre y los recetados solamente como se lo haya indicado el mdico. Estos incluyen los suplementos de [place].  Practique ejercicios de rehabilitacin del suelo plvico, como la respiracin profunda mientras relaja la parte inferior del abdomen y relajacin del suelo plvico mientras defeca.  Controle su afeccin para ver si hay cambios.  Concurra a todas las visitas de control como se lo haya indicado el mdico. Esto es importante. SOLICITE ATENCIN MDICA SI:  Siente un dolor que empeora.  Tiene fiebre.  No defeca despus de 4das.  Vomita.  No tiene hambre.  Pierde peso.  Tiene una hemorragia en el ano.  Las heces son delgadas como un lpiz.  SOLICITE ATENCIN MDICA DE INMEDIATO SI:  Tiene fiebre y los sntomas empeoran repentinamente.  Observa que se filtran heces o hay sangre en las heces.  Tiene el abdomen hinchado.  Siente un dolor intenso en el abdomen.  Se siente mareado o se desmaya.  Esta informacin  no tiene Theme park manager el consejo del mdico. Asegrese de hacerle al mdico cualquier pregunta que tenga. Document Released: 11/27/2007 Document Revised: 11/28/2014 Document Reviewed: 04/27/2016 Elsevier Interactive Patient Education  2017 Elsevier Inc.    Hemorroides (Hemorrhoids) Las hemorroides son venas inflamadas adentro o alrededor del recto o del ano. Hay dos tipos de hemorroides:  Hemorroides internas. Se forman en las venas del interior del recto. Pueden abultarse hacia afuera, irritarse y doler.  Hemorroides externas. Se producen en las venas externas del ano y pueden sentirse como un bulto o zona hinchada, dura y dolorosa cerca del ano. La mayora de las hemorroides no causan problemas graves y se Sports coach con tratamientos caseros [employer] cambios en la dieta y el estilo de vida. Si los tratamientos caseros no ayudan con los sntomas, se pueden Education officer, environmental procedimientos para reducir o extirpar las hemorroides. CAUSAS La causa de esta afeccin es el aumento de la presin en la zona anal. Esta presin puede ser causada por distintos factores, por ejemplo:  Estreimiento.  Dificultad para defecar.  Diarrea.  Embarazo.  Obesidad.  Estar sentado durante largos perodos.  Levantar objetos pesados u otras actividades que impliquen esfuerzo.  Sexo anal. SNTOMAS Los sntomas de esta afeccin incluyen lo siguiente:  Dolor.  Picazn o irritacin anal.  Sangrado rectal.  Prdida de materia fecal (heces).  Inflamacin anal.  Uno o ms bultos alrededor del ano. DIAGNSTICO Esta afeccin se diagnostica frecuentemente a travs de un examen visual. Posiblemente le realicen otros tipos de pruebas o estudios, como los siguientes:  Examen de la zona rectal con Edison Simon  enguantada (examen digital rectal).  Examen del canal anal utilizando un pequeo tubo (anoscopio).  Anlisis de sangre si ha perdido [place] Fas[place]a cantidad significativa de [place]sangre.  Un estudio para  observar el interior del colon (sigmoidoscopia o colonoscopia). TRATAMIENTO Esta afeccin generalmente se puede tratar en el hogar. Se pueden realizar diversos procedimientos si los cambios en la dieta, en el estilo de vida y otros tratamientos caseros no Pulte Homesalivian los sntomas. Estos procedimientos pueden ayudar a reducir o extirpar las hemorroides completamente. Algunos de estos procedimientos son quirrgicos y otros no. Algunos de los procedimientos ms frecuentes son los siguientes:  Ligadura con Curatorbanda elstica. Las bandas elsticas se colocan en la base de las hemorroides para interrumpir la irrigacin de Nathropsangre.  Escleroterapia. Se inyecta un medicamento en las hemorroides para reducir su tamao.  Coagulacin con luz infrarroja. Se utiliza un tipo de energa lumnica para eliminar las hemorroides.  Hemorroidectoma. Las hemorroides se extirpan con Azerbaijanciruga y las venas que las Spainirrigan se Web designeratan.  Hemorroidopexia con grapas. Se Botswanausa un dispositivo tipo grapa de forma circular para extirpar las hemorroides y unas grapas para cortar la sangre que se irriga hacia las hemorroides. INSTRUCCIONES PARA EL CUIDADO EN EL HOGAR Comida y bebida  Consuma alimentos con alto contenido de Argylefibra, como cereales integrales, porotos, frutos secos, frutas y verduras. Pregntele a su mdico acerca de tomar productos con fibra aadida en ellos (complementos de fibra).  Beba suficiente lquido para Photographermantener la orina clara o de color amarillo plido. Control del dolor y la hinchazn  Tome baos de asiento tibios durante 20 minutos, 3 o 4 veces por da para Primary school teachercalmar el dolor y las [place] Hillmolestias.  Si se lo indican, aplique hielo en la zona afectada. Usar compresas de Owens-Illinoishielo entre los baos de asiento puede ser [place] Valleyefectivo. ? Ponga el hielo en una bolsa plstica. ? Coloque una FirstEnergy Corptoalla entre la piel y la bolsa de hielo. ? Coloque el hielo durante 20 minutos, 2 a 3 veces por da. Instrucciones generales  Baxter Internationalome los medicamentos de  venta libre y los recetados solamente como se lo haya indicado el mdico.  Aplquese los medicamentos, cremas o supositorios como se lo hayan indicado.  Haga ejercicios regularmente.  Vaya al bao cuando sienta la necesidad de defecar. No espere.  Evite hacer fuerza al defecar.  Mantenga la zona anal limpia y seca. Use papel higinico hmedo o toallitas humedecidas despus de defecar.  No pase mucho tiempo sentado en el inodoro. Esto aumenta la afluencia de sangre y Chief Technology Officerel dolor. SOLICITE ATENCIN MDICA SI:  Aumenta el dolor y la hinchazn, y no puede controlarlos con los medicamentos o con Pharmacist, communityun tratamiento.  Tiene una hemorragia que no Magazine features editorpuede controlar.  No puede defecar o lo hace con dificultad.  Siente dolor o tiene inflamacin fuera de la zona de las hemorroides.  Esta informacin no tiene Theme park managercomo fin reemplazar el consejo del mdico. Asegrese de hacerle al mdico cualquier pregunta que tenga. Document Released: 11/07/2005 Document Revised: 02/29/2016 Document Reviewed: 07/22/2015 Elsevier Interactive Patient Education  2017 [employer]

## 2017-05-29 NOTE — Progress Notes (Signed)
BP 126/90 (BP Location: Left Arm, Patient Position: Sitting, Cuff Size: Normal)   Pulse 92   Temp 97.9 F (36.6 C)   Ht 5' 6.5" (1.689 m)   Wt 180 lb 4 oz (81.8 kg)   SpO2 98%   BMI 28.66 kg/m    Subjective:    Patient ID: Arthur Walker, male    DOB: [birth year], 36 y.o.   MRN: [id number]  HPI: Arthur Walker is a 36 y.o. male presenting on 05/29/2017 for Gastroesophageal Reflux; Tinnitus; and Hyperlipidemia   HPI   Pt has still not turned in his [employer] discount application.   Pt says he ran out of all of his meds over a month ago.    His stomach isn't too bad now.   He says he isn't working now and that has helped (he worked at [employer] and ate a lot of spicy foods)  Pt thinks he has hemorrhoids.  He currently has no blood PR.  He has a thing at his rectum, a bump.   It has been there for 3 years.  It has bled just a little bit in the past but never a lot.   He says he still has the ringing in his ears.  He says it is all the time. No dizziness.  No pain in ears.   It has now been going for about 9 months.  It bothers him.      Relevant past medical, surgical, family and social history reviewed and updated as indicated. Interim medical history since our last visit reviewed. Allergies and medications reviewed and updated.    Current Outpatient Prescriptions:  .  lovastatin (MEVACOR) 20 MG tablet, 1 po qhs for cholesterol.   Tome una tableta por boca al dormir (Patient not taking: Reported on 03/02/2017), Disp: 30 tablet, Rfl: 3 .  pantoprazole (PROTONIX) 40 MG tablet, Take 1 tab po qd. Tome una tableta por boca una ves al dia (Patient not taking: Reported on 05/29/2017), Disp: 30 tablet, Rfl: 3  Review of Systems  Constitutional: Positive for unexpected weight change. Negative for appetite change, chills, diaphoresis, fatigue and fever.  HENT: Negative for congestion, dental problem, drooling, ear pain, facial swelling, hearing loss, mouth sores, sneezing, sore  throat, trouble swallowing and voice change.   Eyes: Negative for pain, discharge, redness, itching and visual disturbance.  Respiratory: Negative for cough, choking, shortness of breath and wheezing.   Cardiovascular: Negative for chest pain, palpitations and leg swelling.  Gastrointestinal: Positive for blood in stool and constipation. Negative for abdominal pain, diarrhea and vomiting.  Endocrine: Negative for cold intolerance, heat intolerance and polydipsia.  Genitourinary: Negative for decreased urine volume, dysuria and hematuria.  Musculoskeletal: Positive for back pain. Negative for arthralgias and gait problem.  Skin: Negative for rash.  Allergic/Immunologic: Negative for environmental allergies.  Neurological: Positive for headaches. Negative for seizures, syncope and light-headedness.  Hematological: Negative for adenopathy.  Psychiatric/Behavioral: Negative for agitation, dysphoric mood and suicidal ideas. The patient is not nervous/anxious.     Per HPI unless specifically indicated above     Objective:    BP 126/90 (BP Location: Left Arm, Patient Position: Sitting, Cuff Size: Normal)   Pulse 92   Temp 97.9 F (36.6 C)   Ht 5' 6.5" (1.689 m)   Wt 180 lb 4 oz (81.8 kg)   SpO2 98%   BMI 28.66 kg/m   Wt Readings from Last 3 Encounters:  05/29/17 180 lb 4 oz (81.8 kg)  03/02/17 169 lb 12 oz (77 kg)  01/30/17 169 lb 8 oz (76.9 kg)    Physical Exam  Constitutional: He is oriented to person, place, and time. He appears well-developed and well-nourished.  HENT:  Head: Normocephalic and atraumatic.  Right Ear: Hearing, tympanic membrane, external ear and ear canal normal.  Left Ear: Hearing, tympanic membrane, external ear and ear canal normal.  Nose: Nose normal.  Mouth/Throat: Uvula is midline and oropharynx is clear and moist. No uvula swelling. No oropharyngeal exudate, posterior oropharyngeal edema, posterior oropharyngeal erythema or tonsillar abscesses.  Neck:  Neck supple.  Cardiovascular: Normal rate and regular rhythm.   Pulmonary/Chest: Effort normal and breath sounds normal. He has no wheezes.  Abdominal: Soft. Bowel sounds are normal. There is no hepatosplenomegaly. There is no tenderness.  Genitourinary:  Genitourinary Comments: Pt refused exam of rectal area.  He states he didn't shower this morning yet and wants to wait to get examined until his next OV.   Musculoskeletal: He exhibits no edema.  Lymphadenopathy:    He has no cervical adenopathy.  Neurological: He is alert and oriented to person, place, and time.  Skin: Skin is warm and dry.  Psychiatric: He has a normal mood and affect. His behavior is normal.  Vitals reviewed.   Results for orders placed or performed during the hospital encounter of 05/26/17  Comprehensive metabolic panel  Result Value Ref Range   Sodium 140 135 - 145 mmol/L   Potassium 4.8 3.5 - 5.1 mmol/L   Chloride 106 101 - 111 mmol/L   CO2 29 22 - 32 mmol/L   Glucose, Bld 112 (H) 65 - 99 mg/dL   BUN 16 6 - 20 mg/dL   Creatinine, Ser 1.61 0.61 - 1.24 mg/dL   Calcium 8.7 (L) 8.9 - 10.3 mg/dL   Total Protein 7.7 6.5 - 8.1 g/dL   Albumin 3.8 3.5 - 5.0 g/dL   AST 30 15 - 41 U/L   ALT 35 17 - 63 U/L   Alkaline Phosphatase 79 38 - 126 U/L   Total Bilirubin 0.5 0.3 - 1.2 mg/dL   GFR calc non Af Amer >60 >60 mL/min   GFR calc Af Amer >60 >60 mL/min   Anion gap 5 5 - 15  Lipid Profile  Result Value Ref Range   Cholesterol 212 (H) 0 - 200 mg/dL   Triglycerides 096 (H) <150 mg/dL   HDL 35 (L) >04 mg/dL   Total CHOL/HDL Ratio 6.1 RATIO   VLDL 52 (H) 0 - 40 mg/dL   LDL Cholesterol 540 (H) 0 - 99 mg/dL      Assessment & Plan:    Encounter Diagnoses  Name Primary?  . Hyperlipidemia, unspecified hyperlipidemia type Yes  . Tinnitus of both ears   . Constipation, unspecified constipation type   . Gastroesophageal reflux disease, esophagitis presence not specified     -reviewed labs with pt -pt counseled  to Get back on lovastatin -recommended Fiber and water for constipation and hemorrhoids. Gave reading information. -pt counseled to Turn in [employer] discount application- pt was given another form -Discussed referral to ENT and that he needs to get his [employer] discount application turned in. -pt to follow up in 3 month.  He is to RTO sooner prn

## 2017-08-28 ENCOUNTER — Ambulatory Visit: Payer: Self-pay | Admitting: Physician Assistant

## 2017-09-04 ENCOUNTER — Encounter: Payer: Self-pay | Admitting: Physician Assistant

## 2017-09-18 ENCOUNTER — Encounter: Payer: Self-pay | Admitting: Physician Assistant

## 2017-09-18 ENCOUNTER — Ambulatory Visit: Payer: Self-pay | Admitting: Physician Assistant

## 2017-09-18 VITALS — BP 128/84 | HR 73 | Temp 97.7°F | Ht 66.5 in | Wt 174.5 lb

## 2017-09-18 DIAGNOSIS — L309 Dermatitis, unspecified: Secondary | ICD-10-CM

## 2017-09-18 MED ORDER — DIPHENHYDRAMINE HCL 25 MG PO TABS: ORAL_TABLET | ORAL | 0 refills | Status: DC

## 2017-09-18 MED ORDER — PREDNISONE 10 MG PO TABS
ORAL_TABLET | ORAL | 0 refills | Status: DC
Start: 2017-09-18 — End: 2017-09-25

## 2017-09-18 NOTE — Progress Notes (Signed)
BP 128/84 (BP Location: Left Arm, Patient Position: Sitting, Cuff Size: Normal)   Pulse 73   Temp 97.7 F (36.5 C)   Ht 5' 6.5" (1.689 m)   Wt 174 lb 8 oz (79.2 kg)   SpO2 98%   BMI 27.74 kg/m    Subjective:    Patient ID: Arthur Walker, male    DOB: [birth year], 36 y.o.   MRN: [id number]  HPI: Arthur Walker is a 36 y.o. male presenting on 09/18/2017 for Rash (on face. pt states unsure if it is allergic reaction to something, or is it is acne. pt states has been for 2 months. pt states sometimes itchy. )   HPI  Chief Complaint  Patient presents with  . Rash    on face. pt states unsure if it is allergic reaction to something, or is it is acne. pt states has been for 2 months. pt states sometimes itchy.    Pt states rash comes and goes.  He has not tried anything on it to try to help it.   He tries not to scratch it because that makes it more red.   At one time it was on his chest but mostly just on his face.    Pt works at [employer].  He has worked there for 2 1/2 years.    Pt has used same shampoo for years (Suave) but uses different soaps all the time.   Relevant past medical, surgical, family and social history reviewed and updated as indicated. Interim medical history since our last visit reviewed. Allergies and medications reviewed and updated.  CURRENT MEDS: none  Review of Systems  Constitutional: Positive for fatigue. Negative for appetite change, chills, diaphoresis, fever and unexpected weight change.  HENT: Positive for dental problem, ear pain and mouth sores. Negative for congestion, drooling, facial swelling, hearing loss, sneezing, sore throat, trouble swallowing and voice change.   Eyes: Negative for pain, discharge, redness, itching and visual disturbance.  Respiratory: Negative for cough, choking, shortness of breath and wheezing.   Cardiovascular: Negative for chest pain, palpitations and leg swelling.  Gastrointestinal: Positive for  constipation. Negative for abdominal pain, blood in stool, diarrhea and vomiting.  Endocrine: Negative for cold intolerance, heat intolerance and polydipsia.  Genitourinary: Negative for decreased urine volume, dysuria and hematuria.  Musculoskeletal: Positive for arthralgias and back pain. Negative for gait problem.  Skin: Negative for rash.  Allergic/Immunologic: Positive for environmental allergies.  Neurological: Positive for headaches. Negative for seizures, syncope and light-headedness.  Hematological: Negative for adenopathy.  Psychiatric/Behavioral: Negative for agitation, dysphoric mood and suicidal ideas. The patient is not nervous/anxious.     Per HPI unless specifically indicated above     Objective:    BP 128/84 (BP Location: Left Arm, Patient Position: Sitting, Cuff Size: Normal)   Pulse 73   Temp 97.7 F (36.5 C)   Ht 5' 6.5" (1.689 m)   Wt 174 lb 8 oz (79.2 kg)   SpO2 98%   BMI 27.74 kg/m   Wt Readings from Last 3 Encounters:  09/18/17 174 lb 8 oz (79.2 kg)  05/29/17 180 lb 4 oz (81.8 kg)  03/02/17 169 lb 12 oz (77 kg)    Physical Exam  Constitutional: He is oriented to person, place, and time. He appears well-developed and well-nourished.  HENT:  Head: Normocephalic and atraumatic.  Neck: Neck supple.  Cardiovascular: Normal rate and regular rhythm.   Pulmonary/Chest: Effort normal and breath sounds normal. He has no  wheezes.  Musculoskeletal: He exhibits no edema.  Lymphadenopathy:    He has no cervical adenopathy.  Neurological: He is alert and oriented to person, place, and time.  Skin: Skin is warm and dry. Rash noted.  Reddish splotchy areas on face. No discrete lesions.  Skin is dry. No plaques or scales  Psychiatric: He has a normal mood and affect. His behavior is normal.  Vitals reviewed.        Assessment & Plan:   Encounter Diagnosis  Name Primary?  . Dermatitis Yes     -pt counseled to use dove soap because it is mild  -rx short  course prednisone.   benedryl as needed -pt has routine appt next week

## 2017-09-21 ENCOUNTER — Other Ambulatory Visit (HOSPITAL_COMMUNITY)
Admission: RE | Admit: 2017-09-21 | Discharge: 2017-09-21 | Disposition: A | Discharge status: Home / Self Care | Payer: Self-pay | Source: Ambulatory Visit | Attending: Physician Assistant | Admitting: Physician Assistant

## 2017-09-21 DIAGNOSIS — E785 Hyperlipidemia, unspecified: Secondary | ICD-10-CM | POA: Insufficient documentation

## 2017-09-21 LAB — LIPID PANEL
CHOL/HDL RATIO: 5.5 ratio
CHOLESTEROL: 300 mg/dL — AB (ref 0–200)
HDL: 55 mg/dL (ref >40)
LDL Cholesterol: 208 mg/dL — ABNORMAL HIGH (ref 0–99)
TRIGLYCERIDES: 186 mg/dL — AB (ref <150)
VLDL: 37 mg/dL (ref 0–40)

## 2017-09-21 LAB — HEPATIC FUNCTION PANEL
ALK PHOS: 63 U/L (ref 38–126)
ALT: 22 U/L (ref 17–63)
AST: 18 U/L (ref 15–41)
Albumin: 4.1 g/dL (ref 3.5–5.0)
BILIRUBIN TOTAL: 0.6 mg/dL (ref 0.3–1.2)
Total Protein: 8 g/dL (ref 6.5–8.1)

## 2017-09-25 ENCOUNTER — Other Ambulatory Visit: Payer: Self-pay | Admitting: Physician Assistant

## 2017-09-25 ENCOUNTER — Encounter: Payer: Self-pay | Admitting: Physician Assistant

## 2017-09-25 ENCOUNTER — Ambulatory Visit: Payer: Self-pay | Admitting: Physician Assistant

## 2017-09-25 VITALS — BP 132/76 | HR 69 | Temp 97.7°F | Ht 66.5 in | Wt 174.5 lb

## 2017-09-25 DIAGNOSIS — H9313 Tinnitus, bilateral: Secondary | ICD-10-CM

## 2017-09-25 DIAGNOSIS — R109 Unspecified abdominal pain: Secondary | ICD-10-CM

## 2017-09-25 DIAGNOSIS — Z87442 Personal history of urinary calculi: Secondary | ICD-10-CM

## 2017-09-25 DIAGNOSIS — Z8719 Personal history of other diseases of the digestive system: Secondary | ICD-10-CM

## 2017-09-25 DIAGNOSIS — E785 Hyperlipidemia, unspecified: Secondary | ICD-10-CM

## 2017-09-25 DIAGNOSIS — Z91199 Patient's noncompliance with other medical treatment and regimen due to unspecified reason: Secondary | ICD-10-CM

## 2017-09-25 DIAGNOSIS — Z1211 Encounter for screening for malignant neoplasm of colon: Secondary | ICD-10-CM

## 2017-09-25 DIAGNOSIS — Z9119 Patient's noncompliance with other medical treatment and regimen: Secondary | ICD-10-CM

## 2017-09-25 LAB — POCT URINALYSIS DIPSTICK
Bilirubin, UA: NEGATIVE
GLUCOSE UA: NEGATIVE
KETONES UA: NEGATIVE
Leukocytes, UA: NEGATIVE
Nitrite, UA: NEGATIVE
PROTEIN UA: NEGATIVE
SPEC GRAV UA: 1.02 (ref 1.010–1.025)
Urobilinogen, UA: 0.2 E.U./dL
pH, UA: 5 (ref 5.0–8.0)

## 2017-09-25 MED ORDER — SIMVASTATIN 20 MG PO TABS: ORAL_TABLET | ORAL | 4 refills | Status: DC

## 2017-09-25 NOTE — Progress Notes (Signed)
BP 132/76 (BP Location: Left Arm, Patient Position: Sitting, Cuff Size: Normal)   Pulse 69   Temp 97.7 F (36.5 C)   Ht 5' 6.5" (1.689 m)   Wt 174 lb 8 oz (79.2 kg)   SpO2 98%   BMI 27.74 kg/m    Subjective:    Patient ID: Arthur Walker, male    DOB: July 13, 1981, 36 y.o.   MRN: [id number]  HPI: Arthur Walker is a 36 y.o. male presenting on 09/25/2017 for Hyperlipidemia; Hemorrhoids; and Tinnitus   HPI    Chief Complaint  Patient presents with  . Hyperlipidemia  . Hemorrhoids  . Tinnitus     Pt still has not turned in his [employer] discount application.  He says he doesn't want to ask for help.   (it was given to pt to help with the CT that he had done)  Pt concerned about colon cancer because his father was recently diagnosed with it.  His father was  Older than 38 yo at time of diagnosis (he isn't sure of his fathers age).   Pt is not taking his cholesterol medication.  He took one bottle and never got it refilled.   Pt is still having ringing in both.    Pt says that the rectal bump went away.  He says when it comes, it only lasts a week or two.  It is thought to be a hemorrhoid.   Relevant past medical, surgical, family and social history reviewed and updated as indicated. Interim medical history since our last visit reviewed. Allergies and medications reviewed and updated.   Current Outpatient Medications:  .  diphenhydrAMINE (BENADRYL) 25 MG tablet, 1 po q 6 hour prn itching 1 tableta por boca cada 6 horas cuando sea necesario para la comezon, Disp: 30 tablet, Rfl: 0 .  lovastatin (MEVACOR) 20 MG tablet, 1 po qhs for cholesterol.   Tome una tableta por boca al dormir (Patient not taking: Reported on 09/18/2017), Disp: 30 tablet, Rfl: 3   Review of Systems  Constitutional: Positive for fatigue. Negative for appetite change, chills, diaphoresis, fever and unexpected weight change.  HENT: Positive for dental problem and sneezing. Negative for congestion,  drooling, ear pain, facial swelling, hearing loss, mouth sores, sore throat, trouble swallowing and voice change.   Eyes: Negative for pain, discharge, redness, itching and visual disturbance.  Respiratory: Negative for cough, choking, shortness of breath and wheezing.   Cardiovascular: Negative for chest pain, palpitations and leg swelling.  Gastrointestinal: Negative for abdominal pain, blood in stool, constipation, diarrhea and vomiting.  Endocrine: Negative for cold intolerance, heat intolerance and polydipsia.  Genitourinary: Negative for decreased urine volume, dysuria and hematuria.  Musculoskeletal: Positive for back pain. Negative for arthralgias and gait problem.  Skin: Negative for rash.  Allergic/Immunologic: Positive for environmental allergies.  Neurological: Positive for headaches. Negative for seizures, syncope and light-headedness.  Hematological: Negative for adenopathy.  Psychiatric/Behavioral: Negative for agitation, dysphoric mood and suicidal ideas. The patient is not nervous/anxious.     Per HPI unless specifically indicated above     Objective:    BP 132/76 (BP Location: Left Arm, Patient Position: Sitting, Cuff Size: Normal)   Pulse 69   Temp 97.7 F (36.5 C)   Ht 5' 6.5" (1.689 m)   Wt 174 lb 8 oz (79.2 kg)   SpO2 98%   BMI 27.74 kg/m   Wt Readings from Last 3 Encounters:  09/25/17 174 lb 8 oz (79.2 kg)  09/18/17 174  lb 8 oz (79.2 kg)  05/29/17 180 lb 4 oz (81.8 kg)    Physical Exam  Constitutional: He is oriented to person, place, and time. He appears well-developed and well-nourished.  HENT:  Head: Normocephalic and atraumatic.  Right Ear: Hearing, tympanic membrane, external ear and ear canal normal.  Left Ear: Hearing, tympanic membrane, external ear and ear canal normal.  Nose: Nose normal.  Mouth/Throat: Uvula is midline and oropharynx is clear and moist. No uvula swelling. No oropharyngeal exudate, posterior oropharyngeal edema, posterior  oropharyngeal erythema or tonsillar abscesses.  Neck: Neck supple.  Cardiovascular: Normal rate and regular rhythm.  Pulmonary/Chest: Effort normal and breath sounds normal. He has no wheezes.  Abdominal: Soft. Bowel sounds are normal. There is no hepatosplenomegaly. There is no tenderness.  Musculoskeletal: He exhibits no edema.  Lymphadenopathy:    He has no cervical adenopathy.  Neurological: He is alert and oriented to person, place, and time.  Skin: Skin is warm and dry.  Psychiatric: He has a normal mood and affect. His behavior is normal.  Vitals reviewed.   Results for orders placed or performed during the hospital encounter of 09/21/17  Lipid panel  Result Value Ref Range   Cholesterol 300 (H) 0 - 200 mg/dL   Triglycerides 409 (H) <150 mg/dL   HDL 55 >81 mg/dL   Total CHOL/HDL Ratio 5.5 RATIO   VLDL 37 0 - 40 mg/dL   LDL Cholesterol 191 (H) 0 - 99 mg/dL  Hepatic function panel  Result Value Ref Range   Total Protein 8.0 6.5 - 8.1 g/dL   Albumin 4.1 3.5 - 5.0 g/dL   AST 18 15 - 41 U/L   ALT 22 17 - 63 U/L   Alkaline Phosphatase 63 38 - 126 U/L   Total Bilirubin 0.6 0.3 - 1.2 mg/dL   Bilirubin, Direct <4.7 (L) 0.1 - 0.5 mg/dL   Indirect Bilirubin NOT CALCULATED 0.3 - 0.9 mg/dL      Assessment & Plan:   Encounter Diagnoses  Name Primary?  . Hyperlipidemia, unspecified hyperlipidemia type Yes  . Tinnitus of both ears   . History of kidney stones   . History of diverticulitis   . Flank pain   . Screening for colon cancer   . Personal history of noncompliance with medical treatment, presenting hazards to health     -Reviewed labs with pt -Gave pt another [employer] discount application -will Refer to ENT for persistent tinnitis -rx simvastatin for lipids. Counseled to get it refilled.  Follow lowfat diet.  -Gave iFOBT for colon cancer screening -pt to follow up in 3 months. RTO sooner prn

## 2017-12-18 ENCOUNTER — Other Ambulatory Visit (HOSPITAL_COMMUNITY)
Admission: RE | Admit: 2017-12-18 | Discharge: 2017-12-18 | Disposition: A | Discharge status: Home / Self Care | Payer: Self-pay | Source: Ambulatory Visit | Attending: Physician Assistant | Admitting: Physician Assistant

## 2017-12-18 DIAGNOSIS — E785 Hyperlipidemia, unspecified: Secondary | ICD-10-CM | POA: Insufficient documentation

## 2017-12-18 LAB — COMPREHENSIVE METABOLIC PANEL
ALBUMIN: 3.9 g/dL (ref 3.5–5.0)
ALK PHOS: 62 U/L (ref 38–126)
ALT: 27 U/L (ref 17–63)
AST: 23 U/L (ref 15–41)
Anion gap: 6 (ref 5–15)
BILIRUBIN TOTAL: 0.7 mg/dL (ref 0.3–1.2)
BUN: 15 mg/dL (ref 6–20)
CALCIUM: 8.6 mg/dL — AB (ref 8.9–10.3)
CO2: 25 mmol/L (ref 22–32)
CREATININE: 0.85 mg/dL (ref 0.61–1.24)
Chloride: 109 mmol/L (ref 101–111)
GFR calc Af Amer: >60 mL/min (ref >60)
GFR calc non Af Amer: >60 mL/min (ref >60)
GLUCOSE: 109 mg/dL — AB (ref 65–99)
Potassium: 4.1 mmol/L (ref 3.5–5.1)
SODIUM: 140 mmol/L (ref 135–145)
TOTAL PROTEIN: 7.6 g/dL (ref 6.5–8.1)

## 2017-12-18 LAB — LIPID PANEL
Cholesterol: 263 mg/dL — ABNORMAL HIGH (ref 0–200)
HDL: 57 mg/dL (ref >40)
LDL CALC: 182 mg/dL — AB (ref 0–99)
Total CHOL/HDL Ratio: 4.6 RATIO
Triglycerides: 118 mg/dL (ref <150)
VLDL: 24 mg/dL (ref 0–40)

## 2017-12-25 ENCOUNTER — Ambulatory Visit: Payer: Self-pay | Admitting: Physician Assistant

## 2017-12-25 ENCOUNTER — Encounter: Payer: Self-pay | Admitting: Physician Assistant

## 2017-12-25 VITALS — BP 140/92 | HR 75 | Temp 98.1°F | Ht 66.5 in | Wt 178.5 lb

## 2017-12-25 DIAGNOSIS — E785 Hyperlipidemia, unspecified: Secondary | ICD-10-CM

## 2017-12-25 DIAGNOSIS — Z9119 Patient's noncompliance with other medical treatment and regimen: Secondary | ICD-10-CM

## 2017-12-25 DIAGNOSIS — Z91199 Patient's noncompliance with other medical treatment and regimen due to unspecified reason: Secondary | ICD-10-CM

## 2017-12-25 DIAGNOSIS — L309 Dermatitis, unspecified: Secondary | ICD-10-CM

## 2017-12-25 DIAGNOSIS — H9313 Tinnitus, bilateral: Secondary | ICD-10-CM

## 2017-12-25 MED ORDER — SIMVASTATIN 20 MG PO TABS: ORAL_TABLET | ORAL | 4 refills | Status: DC

## 2017-12-25 NOTE — Patient Instructions (Signed)
Colesterol  Cholesterol  El colesterol es una sustancia blanca, cerosa, similar a la grasa, que el cuerpo necesita en pequeñas cantidades. El hígado fabrica todo el colesterol que el cuerpo necesita. La sangre transporta el colesterol desde el hígado a través de los vasos sanguíneos. Los depósitos de colesterol (placas) podrían acumularse en las paredes de los vasos sanguíneos (arterias). Las placas provocan el estrechamiento y la rigidez de las arterias. Las placas de colesterol aumentan el riesgo de infarto de miocardio y accidente cerebrovascular.  Aunque sea muy elevado, la concentración de colesterol no puede percibirse. La única forma de saber que tiene colesterol alto es mediante un análisis de sangre. Una vez que se conocen las concentraciones de colesterol, se debe llevar un registro de los resultados de los análisis. Trabaje con el médico para mantener las concentraciones en el rango deseado.  ¿Qué significan los resultados?  · El colesterol total es una medida general de todo el colesterol en sangre.  · El colesterol LDL (lipoproteínas de baja densidad) es el colesterol “malo”. Este tipo es el que hace que se acumulen placas en las paredes de las arterias. Su concentración debe ser baja.  · El colesterol HDL (lipoproteínas de alta densidad) es el colesterol “bueno”, ya que limpia las arterias y arrastra el LDL. Su concentración debe ser alta.  · Los triglicéridos son grasas que el cuerpo puede quemar ya sea como fuente de energía o para almacenar. Las concentraciones altas están estrechamente vinculadas con las enfermedades cardíacas.  ¿Cuáles son las concentraciones de colesterol deseadas?  · El colesterol total debe estar por debajo de 200.  · Para las personas con riesgo, lo aconsejable es mantener el nivel de LDL por debajo de 100; y, para las personas con alto riesgo, es por debajo de 70.  · Se aconseja mantener un nivel de HDL es por encima de 40. Se considera  que un nivel de 60 o superior protege contra las enfermedades cardíacas.  · Los triglicéridos por debajo de 150.  ¿Cómo puedo bajar el colesterol?  Dieta  Siga el programa de alimentación que el médico le indique.  · Elija el pescado o la carne blanca de pollo y pavo, asados u horneados. Limite los cortes grasos de carne roja, los alimentos fritos y las carnes procesadas, como las salchichas y los embutidos.  · Coma gran cantidad de frutas y verduras frescas.  · Elija los cereales integrales, los frijoles, las pastas, las papas y los cereales.  · Elija aceite de oliva, aceite de maíz o aceite de canola, y solo use poca cantidad.  · No coma mantequilla, mayonesa, margarina o aceites de palmiste.  · Evite los alimentos que contengan grasas trans.  · Tome leche descremada o sin grasa y coma yogur y quesos descremados o sin grasa. Evite la leche entera, la crema, los helados, las yemas de huevo y los quesos enteros.  · Los postres sanos incluyen la torta ángel, los bocadillos de jengibre, las galletas con forma de animales, los caramelos duros, los helados de agua y el yogur helado descremado o semidescremado. Evite las masas, tortas, pasteles y galletas.    La práctica de actividad física.  · Siga el programa de ejercicios que le haya indicado el médico. Programa regular:  ? Ayuda a bajar el colesterol LDL y a aumentar el HDL.  ? Ayuda a controlar el peso.  · Haga cosas que aumenten el nivel de actividad; por ejemplo, haga los trabajos de jardinería, salga a caminar o use las   escaleras.  · Pregunte al médico sobre formas de aumentar la actividad en la vida diaria.    Medicamentos  · Tome los medicamentos de venta libre y los recetados solamente como se lo haya indicado el médico.  ? El médico puede recetarle medicamentos para ayudar a bajar el colesterol y reducir el riesgo de enfermedades cardíacas. Por lo general, esto se hace si con la dieta y la actividad física no se logra disminuir el nivel de colesterol.   ? Si tiene varios factores de riesgo, tal vez tenga que tomar medicamentos, incluso si las concentraciones son normales.    Esta información no tiene como fin reemplazar el consejo del médico. Asegúrese de hacerle al médico cualquier pregunta que tenga.  Document Released: 08/17/2005 Document Revised: 02/06/2017 Document Reviewed: 05/07/2016  [employer] Interactive Patient Education © 2018 [employer]

## 2017-12-25 NOTE — Progress Notes (Signed)
BP (!) 140/92 (BP Location: Left Arm, Patient Position: Sitting, Cuff Size: Normal)   Pulse 75   Temp 98.1 F (36.7 C)   Ht 5' 6.5" (1.689 m)   Wt 178 lb 8 oz (81 kg)   SpO2 99%   BMI 28.38 kg/m    Subjective:    Patient ID: Arthur Walker, male    DOB: 23-Feb-1981, 37 y.o.   MRN: [id number]  HPI: Arthur Walker is a 37 y.o. male presenting on 12/25/2017 for Hyperlipidemia   HPI   Pt only took his cholesterol medication for one month again.  After it ran out, it he did not refill it.  Pt had same conversation at last OV when he was counseled to get it refilled.    Pt states rash on face for 3 months.  pt states it itches and he has a derm appointment at the end of this month  Pt is still with tinnitus.  He says he has not heard back from ENT about appointment  Pt says he turned in his [employer] discount application and he was told he makes too much money.  Relevant past medical, surgical, family and social history reviewed and updated as indicated. Interim medical history since our last visit reviewed. Allergies and medications reviewed and updated.   Current Outpatient Medications:  .  diphenhydrAMINE (BENADRYL) 25 MG tablet, 1 po q 6 hour prn itching 1 tableta por boca cada 6 horas cuando sea necesario para la comezon (Patient not taking: Reported on 12/25/2017), Disp: 30 tablet, Rfl: 0 .  simvastatin (ZOCOR) 20 MG tablet, 1 po qhs for cholesterol.  Tome una tableta por boca al dormir (Patient not taking: Reported on 12/25/2017), Disp: 30 tablet, Rfl: 4   Review of Systems  Constitutional: Negative for appetite change, chills, diaphoresis, fatigue, fever and unexpected weight change.  HENT: Positive for ear pain. Negative for congestion, dental problem, drooling, facial swelling, hearing loss, mouth sores, sneezing, sore throat, trouble swallowing and voice change.   Eyes: Negative for pain, discharge, redness, itching and visual disturbance.  Respiratory: Negative for  cough, choking, shortness of breath and wheezing.   Cardiovascular: Negative for chest pain, palpitations and leg swelling.  Gastrointestinal: Positive for abdominal pain and constipation. Negative for blood in stool, diarrhea and vomiting.  Endocrine: Negative for cold intolerance, heat intolerance and polydipsia.  Genitourinary: Negative for decreased urine volume, dysuria and hematuria.  Musculoskeletal: Negative for arthralgias, back pain and gait problem.  Skin: Negative for rash.  Allergic/Immunologic: Negative for environmental allergies.  Neurological: Negative for seizures, syncope, light-headedness and headaches.  Hematological: Negative for adenopathy.  Psychiatric/Behavioral: Negative for agitation, dysphoric mood and suicidal ideas. The patient is not nervous/anxious.     Per HPI unless specifically indicated above     Objective:    BP (!) 140/92 (BP Location: Left Arm, Patient Position: Sitting, Cuff Size: Normal)   Pulse 75   Temp 98.1 F (36.7 C)   Ht 5' 6.5" (1.689 m)   Wt 178 lb 8 oz (81 kg)   SpO2 99%   BMI 28.38 kg/m   Wt Readings from Last 3 Encounters:  12/25/17 178 lb 8 oz (81 kg)  09/25/17 174 lb 8 oz (79.2 kg)  09/18/17 174 lb 8 oz (79.2 kg)    Physical Exam  Constitutional: He is oriented to person, place, and time. He appears well-developed and well-nourished.  HENT:  Head: Normocephalic and atraumatic.  Right Ear: Hearing, tympanic membrane, external ear and  ear canal normal.  Left Ear: Hearing, tympanic membrane, external ear and ear canal normal.  Nose: Nose normal.  Mouth/Throat: Uvula is midline and oropharynx is clear and moist. No uvula swelling. No oropharyngeal exudate, posterior oropharyngeal edema, posterior oropharyngeal erythema or tonsillar abscesses.  Neck: Neck supple.  Cardiovascular: Normal rate and regular rhythm.  Pulmonary/Chest: Effort normal and breath sounds normal. He has no wheezes.  Abdominal: Soft. Bowel sounds are  normal. There is no hepatosplenomegaly. There is no tenderness.  Musculoskeletal: He exhibits no edema.  Lymphadenopathy:    He has no cervical adenopathy.  Neurological: He is alert and oriented to person, place, and time.  Skin: Skin is warm and dry.  Psychiatric: He has a normal mood and affect. His behavior is normal.  Vitals reviewed.   Results for orders placed or performed during the hospital encounter of 12/18/17  Comprehensive metabolic panel  Result Value Ref Range   Sodium 140 135 - 145 mmol/L   Potassium 4.1 3.5 - 5.1 mmol/L   Chloride 109 101 - 111 mmol/L   CO2 25 22 - 32 mmol/L   Glucose, Bld 109 (H) 65 - 99 mg/dL   BUN 15 6 - 20 mg/dL   Creatinine, Ser 8.65 0.61 - 1.24 mg/dL   Calcium 8.6 (L) 8.9 - 10.3 mg/dL   Total Protein 7.6 6.5 - 8.1 g/dL   Albumin 3.9 3.5 - 5.0 g/dL   AST 23 15 - 41 U/L   ALT 27 17 - 63 U/L   Alkaline Phosphatase 62 38 - 126 U/L   Total Bilirubin 0.7 0.3 - 1.2 mg/dL   GFR calc non Af Amer >60 >60 mL/min   GFR calc Af Amer >60 >60 mL/min   Anion gap 6 5 - 15  Lipid panel  Result Value Ref Range   Cholesterol 263 (H) 0 - 200 mg/dL   Triglycerides 784 <696 mg/dL   HDL 57 >29 mg/dL   Total CHOL/HDL Ratio 4.6 RATIO   VLDL 24 0 - 40 mg/dL   LDL Cholesterol 528 (H) 0 - 99 mg/dL      Assessment & Plan:    Encounter Diagnoses  Name Primary?  . Hyperlipidemia, unspecified hyperlipidemia type Yes  . Personal history of noncompliance with medical treatment, presenting hazards to health   . Tinnitus of both ears   . Dermatitis     -reviewed labs with pt -again counseled pt that he needs to get his rx refilled and continue taking the statin -discussed with pt that he needs to go through screening again due to his elevated income he might not be eligible -will not treat pt for facial rash because that will make it harder for the dermatologist.  Pt in agreement -pt will need follow up and labwork in 3 months.

## 2018-03-26 ENCOUNTER — Ambulatory Visit: Payer: Self-pay | Admitting: Physician Assistant

## 2018-09-03 ENCOUNTER — Encounter (HOSPITAL_COMMUNITY): Payer: Self-pay

## 2018-09-03 ENCOUNTER — Other Ambulatory Visit: Payer: Self-pay

## 2018-09-03 ENCOUNTER — Emergency Department (HOSPITAL_COMMUNITY)
Admission: EM | Admit: 2018-09-03 | Discharge: 2018-09-03 | Disposition: A | Discharge status: Home / Self Care | Payer: Self-pay | Attending: Emergency Medicine | Admitting: Emergency Medicine

## 2018-09-03 DIAGNOSIS — F17210 Nicotine dependence, cigarettes, uncomplicated: Secondary | ICD-10-CM | POA: Insufficient documentation

## 2018-09-03 DIAGNOSIS — R1013 Epigastric pain: Secondary | ICD-10-CM | POA: Insufficient documentation

## 2018-09-03 DIAGNOSIS — E785 Hyperlipidemia, unspecified: Secondary | ICD-10-CM | POA: Insufficient documentation

## 2018-09-03 DIAGNOSIS — K922 Gastrointestinal hemorrhage, unspecified: Secondary | ICD-10-CM | POA: Insufficient documentation

## 2018-09-03 LAB — COMPREHENSIVE METABOLIC PANEL
ALT: 27 U/L (ref 0–44)
ANION GAP: 7 (ref 5–15)
AST: 22 U/L (ref 15–41)
Albumin: 4 g/dL (ref 3.5–5.0)
Alkaline Phosphatase: 61 U/L (ref 38–126)
BUN: 16 mg/dL (ref 6–20)
CO2: 25 mmol/L (ref 22–32)
CREATININE: 0.86 mg/dL (ref 0.61–1.24)
Calcium: 8.8 mg/dL — ABNORMAL LOW (ref 8.9–10.3)
Chloride: 106 mmol/L (ref 98–111)
Glucose, Bld: 138 mg/dL — ABNORMAL HIGH (ref 70–99)
Potassium: 4.6 mmol/L (ref 3.5–5.1)
Sodium: 138 mmol/L (ref 135–145)
Total Bilirubin: 0.5 mg/dL (ref 0.3–1.2)
Total Protein: 8.1 g/dL (ref 6.5–8.1)

## 2018-09-03 LAB — URINALYSIS, ROUTINE W REFLEX MICROSCOPIC
BILIRUBIN URINE: NEGATIVE
GLUCOSE, UA: NEGATIVE mg/dL
HGB URINE DIPSTICK: NEGATIVE
Ketones, ur: NEGATIVE mg/dL
Leukocytes, UA: NEGATIVE
Nitrite: NEGATIVE
PH: 5 (ref 5.0–8.0)
Protein, ur: NEGATIVE mg/dL
SPECIFIC GRAVITY, URINE: 1.018 (ref 1.005–1.030)

## 2018-09-03 LAB — CBC
HEMATOCRIT: 46 % (ref 39.0–52.0)
Hemoglobin: 15.3 g/dL (ref 13.0–17.0)
MCH: 31.7 pg (ref 26.0–34.0)
MCHC: 33.3 g/dL (ref 30.0–36.0)
MCV: 95.4 fL (ref 80.0–100.0)
NRBC: 0 % (ref 0.0–0.2)
Platelets: 209 10*3/uL (ref 150–400)
RBC: 4.82 MIL/uL (ref 4.22–5.81)
RDW: 13.5 % (ref 11.5–15.5)
WBC: 8.3 10*3/uL (ref 4.0–10.5)

## 2018-09-03 LAB — LIPASE, BLOOD: Lipase: 43 U/L (ref 11–51)

## 2018-09-03 MED ORDER — OMEPRAZOLE 20 MG PO CPDR: 20.0000 mg | DELAYED_RELEASE_CAPSULE | Freq: Every day | ORAL | 0 refills | Status: DC

## 2018-09-03 NOTE — ED Provider Notes (Signed)
[facility] EMERGENCY DEPARTMENT Provider Note   CSN: [id number] Arrival date & time: 09/03/18  7829     History   Chief Complaint Chief Complaint  Patient presents with  . Abdominal Pain    HPI Crew Arthur Walker is a 37 y.o. male. Patient speaks Spanish primarily and is translated by family member. HPI Patient presents with epigastric abdominal pain.  Dull in his mid abdomen.  He will drink around 5 beers a day.  Nausea without vomiting.  Has had some blood with going to the bathroom for 3 different times.  Also associated with epigastric pain at those times.  The blood is mostly with wiping but has had a little of the stool.  Stools reportedly normal colors.  Pain in the abdomen is dull.  No trauma.  No other bleeding. History reviewed. No pertinent past medical history.  Patient Active Problem List   Diagnosis Date Noted  . Hyperlipidemia 05/29/2017  . Gastroesophageal reflux disease 01/30/2017  . Lung nodule seen on imaging study 01/30/2017  . Cigarette nicotine dependence without complication 01/02/2017    History reviewed. No pertinent surgical history.      Home Medications    Prior to Admission medications   Medication Sig Start Date End Date Taking? Authorizing Provider  omeprazole (PRILOSEC) 20 MG capsule Take 1 capsule (20 mg total) by mouth daily. 09/03/18   Benjiman Core, MD    Family History Family History  Problem Relation Age of Onset  . Diabetes Mother   . Hypertension Mother   . Diabetes Father   . Diabetes Maternal Uncle   . Hypertension Maternal Uncle     Social History Social History   Tobacco Use  . Smoking status: Former Smoker    Packs/day: 0.25    Years: 17.00    Pack years: 4.25    Types: Cigarettes    Last attempt to quit: 01/30/2017    Years since quitting: 1.5  . Smokeless tobacco: Never Used  Substance Use Topics  . Alcohol use: Yes    Alcohol/week: 2.0 standard drinks    Types: 2 Cans of beer per week   Comment: 2  32oz beer daily  . Drug use: No     Allergies   Patient has no known allergies.   Review of Systems Review of Systems  Constitutional: Negative for appetite change.  HENT: Negative for congestion.   Respiratory: Negative for shortness of breath.   Gastrointestinal: Positive for abdominal pain and blood in stool.  Genitourinary: Negative for flank pain.  Musculoskeletal: Negative for back pain.  Skin: Negative for rash.  Neurological: Negative for weakness.  Psychiatric/Behavioral: Negative for confusion.     Physical Exam Updated Vital Signs BP 126/88   Pulse 63   Temp 97.7 F (36.5 C) (Oral)   Resp 14   Wt 72.6 kg   SpO2 100%   BMI 25.44 kg/m   Physical Exam  Constitutional: He appears well-developed.  HENT:  Head: Normocephalic.  Eyes: Pupils are equal, round, and reactive to light.  Cardiovascular: Regular rhythm.  Pulmonary/Chest: Breath sounds normal.  Abdominal: Normal appearance. There is tenderness.  Epigastric tenderness without rebound or guarding.  Skin: Capillary refill takes less than 2 seconds. No pallor.     ED Treatments / Results  Labs (all labs ordered are listed, but only abnormal results are displayed) Labs Reviewed  COMPREHENSIVE METABOLIC PANEL - Abnormal; Notable for the following components:      Result Value   Glucose, Bld 138 (*)  Calcium 8.8 (*)    All other components within normal limits  LIPASE, BLOOD  CBC  URINALYSIS, ROUTINE W REFLEX MICROSCOPIC    EKG None  Radiology No results found.  Procedures Procedures (including critical care time)  Medications Ordered in ED Medications - No data to display   Initial Impression / Assessment and Plan / ED Course  I have reviewed the triage vital signs and the nursing notes.  Pertinent labs & imaging results that were available during my care of the patient were reviewed by me and considered in my medical decision making (see chart for details).      Patient with epigastric abdominal pain.  Somewhat heavy drinker.  Labs reassuring.  Has normal hemoglobin.  Deferred rectal exam.  Has had previous hemorrhoids.  Will have follow-up with GI.  Will start Prilosec.  May have an alcoholic gastritis although I think this would not be the cause of the blood with his wiping.  Final Clinical Impressions(s) / ED Diagnoses   Final diagnoses:  Epigastric pain  Gastrointestinal hemorrhage, unspecified gastrointestinal hemorrhage type    ED Discharge Orders         Ordered    omeprazole (PRILOSEC) 20 MG capsule  Daily     09/03/18 1340           Benjiman Core, MD 09/03/18 1343

## 2018-09-03 NOTE — ED Triage Notes (Signed)
Pt experiencing abdominal pain since last pm. Reports large amount of red blood in stool last night. Reports blood in stool on 3 separate occassions

## 2018-11-05 ENCOUNTER — Encounter (HOSPITAL_COMMUNITY): Payer: Self-pay | Admitting: Emergency Medicine

## 2018-11-05 ENCOUNTER — Emergency Department (HOSPITAL_COMMUNITY): Payer: Self-pay

## 2018-11-05 ENCOUNTER — Other Ambulatory Visit: Payer: Self-pay

## 2018-11-05 ENCOUNTER — Emergency Department (HOSPITAL_COMMUNITY)
Admission: EM | Admit: 2018-11-05 | Discharge: 2018-11-05 | Disposition: A | Discharge status: Home / Self Care | Payer: Self-pay | Attending: Emergency Medicine | Admitting: Emergency Medicine

## 2018-11-05 DIAGNOSIS — Z87891 Personal history of nicotine dependence: Secondary | ICD-10-CM | POA: Insufficient documentation

## 2018-11-05 DIAGNOSIS — R1013 Epigastric pain: Secondary | ICD-10-CM

## 2018-11-05 DIAGNOSIS — K8020 Calculus of gallbladder without cholecystitis without obstruction: Secondary | ICD-10-CM | POA: Insufficient documentation

## 2018-11-05 LAB — CBC
HCT: 46.9 % (ref 39.0–52.0)
HEMOGLOBIN: 15.7 g/dL (ref 13.0–17.0)
MCH: 32.2 pg (ref 26.0–34.0)
MCHC: 33.5 g/dL (ref 30.0–36.0)
MCV: 96.1 fL (ref 80.0–100.0)
PLATELETS: 207 10*3/uL (ref 150–400)
RBC: 4.88 MIL/uL (ref 4.22–5.81)
RDW: 13.5 % (ref 11.5–15.5)
WBC: 9.2 10*3/uL (ref 4.0–10.5)
nRBC: 0 % (ref 0.0–0.2)

## 2018-11-05 LAB — URINALYSIS, ROUTINE W REFLEX MICROSCOPIC
BILIRUBIN URINE: NEGATIVE
Glucose, UA: NEGATIVE mg/dL
HGB URINE DIPSTICK: NEGATIVE
Ketones, ur: NEGATIVE mg/dL
Leukocytes, UA: NEGATIVE
NITRITE: NEGATIVE
PROTEIN: NEGATIVE mg/dL
Specific Gravity, Urine: 1.027 (ref 1.005–1.030)
pH: 7 (ref 5.0–8.0)

## 2018-11-05 LAB — COMPREHENSIVE METABOLIC PANEL
ALBUMIN: 4.3 g/dL (ref 3.5–5.0)
ALK PHOS: 67 U/L (ref 38–126)
ALT: 29 U/L (ref 0–44)
ANION GAP: 7 (ref 5–15)
AST: 24 U/L (ref 15–41)
BILIRUBIN TOTAL: 0.7 mg/dL (ref 0.3–1.2)
BUN: 15 mg/dL (ref 6–20)
CALCIUM: 9.1 mg/dL (ref 8.9–10.3)
CO2: 23 mmol/L (ref 22–32)
Chloride: 111 mmol/L (ref 98–111)
Creatinine, Ser: 0.85 mg/dL (ref 0.61–1.24)
GFR calc Af Amer: >60 mL/min (ref >60)
GLUCOSE: 148 mg/dL — AB (ref 70–99)
POTASSIUM: 4.7 mmol/L (ref 3.5–5.1)
Sodium: 141 mmol/L (ref 135–145)
TOTAL PROTEIN: 8.5 g/dL — AB (ref 6.5–8.1)

## 2018-11-05 LAB — LIPASE, BLOOD: Lipase: 38 U/L (ref 11–51)

## 2018-11-05 MED ORDER — MORPHINE SULFATE (PF) 4 MG/ML IV SOLN
4.0000 mg | Freq: Once | INTRAVENOUS | Status: AC
  Administered 2018-11-05: 4 mg via INTRAVENOUS
  Filled 2018-11-05: qty 1

## 2018-11-05 MED ORDER — ONDANSETRON HCL 4 MG/2ML IJ SOLN
4.0000 mg | Freq: Once | INTRAMUSCULAR | Status: AC
  Administered 2018-11-05: 4 mg via INTRAVENOUS
  Filled 2018-11-05: qty 2

## 2018-11-05 MED ORDER — ONDANSETRON HCL 4 MG PO TABS
4.0000 mg | ORAL_TABLET | Freq: Once | ORAL | Status: AC
  Administered 2018-11-05: 4 mg via ORAL
  Filled 2018-11-05: qty 1

## 2018-11-05 MED ORDER — IOPAMIDOL (ISOVUE-300) INJECTION 61%
100.0000 mL | Freq: Once | INTRAVENOUS | Status: AC | PRN
  Administered 2018-11-05: 100 mL via INTRAVENOUS

## 2018-11-05 MED ORDER — AMOXICILLIN-POT CLAVULANATE 875-125 MG PO TABS: 1.0000 | ORAL_TABLET | Freq: Two times a day (BID) | ORAL | 0 refills | Status: DC

## 2018-11-05 MED ORDER — AMOXICILLIN-POT CLAVULANATE 875-125 MG PO TABS
1.0000 | ORAL_TABLET | Freq: Once | ORAL | Status: AC
  Administered 2018-11-05: 1 via ORAL
  Filled 2018-11-05: qty 1

## 2018-11-05 MED ORDER — ONDANSETRON HCL 4 MG PO TABS: 4.0000 mg | ORAL_TABLET | Freq: Four times a day (QID) | ORAL | 0 refills | Status: AC

## 2018-11-05 MED ORDER — HYDROCODONE-ACETAMINOPHEN 5-325 MG PO TABS: 1.0000 | ORAL_TABLET | ORAL | 0 refills | Status: DC | PRN

## 2018-11-05 MED ORDER — IOPAMIDOL (ISOVUE-370) INJECTION 76%: 100.0000 mL | Freq: Once | INTRAVENOUS | Status: DC | PRN

## 2018-11-05 NOTE — ED Provider Notes (Signed)
[facility] EMERGENCY DEPARTMENT Provider Note   CSN: [id number] Arrival date & time: 11/05/18  1304     History   Chief Complaint Chief Complaint  Patient presents with  . Flank Pain    HPI Arthur Walker is a 37 y.o. male.  Patient is a 37 year old male who presents to the emergency department with upper abdomen pain that radiates to the left side and back.  The history is given by the patient through his spouse who is translating for him.  This problem started early this morning.  There is been no history of injury to the abdomen.  There is been no known fever or chills.  No vomiting, no diarrhea.  No previous operations or procedures involving the abdomen.  No excessive use of aspirin or NSAID type products according to the family.  The history is provided by the patient and the spouse. The history is limited by a language barrier. A language interpreter was used.    History reviewed. No pertinent past medical history.  Patient Active Problem List   Diagnosis Date Noted  . Hyperlipidemia 05/29/2017  . Gastroesophageal reflux disease 01/30/2017  . Lung nodule seen on imaging study 01/30/2017  . Cigarette nicotine dependence without complication 01/02/2017    History reviewed. No pertinent surgical history.      Home Medications    Prior to Admission medications   Medication Sig Start Date End Date Taking? Authorizing Provider  omeprazole (PRILOSEC) 20 MG capsule Take 1 capsule (20 mg total) by mouth daily. 09/03/18   Benjiman CorePickering, Nathan, MD    Family History Family History  Problem Relation Age of Onset  . Diabetes Mother   . Hypertension Mother   . Diabetes Father   . Diabetes Maternal Uncle   . Hypertension Maternal Uncle     Social History Social History   Tobacco Use  . Smoking status: Former Smoker    Packs/day: 0.25    Years: 17.00    Pack years: 4.25    Types: Cigarettes    Last attempt to quit: 01/30/2017    Years since quitting: 1.7    . Smokeless tobacco: Never Used  Substance Use Topics  . Alcohol use: Yes    Alcohol/week: 2.0 standard drinks    Types: 2 Cans of beer per week    Comment: 2  32oz beer daily  . Drug use: No     Allergies   Patient has no known allergies.   Review of Systems Review of Systems  Constitutional: Positive for appetite change. Negative for activity change and fever.       All ROS Neg except as noted in HPI  HENT: Negative for nosebleeds.   Eyes: Negative for photophobia and discharge.  Respiratory: Negative for cough, shortness of breath and wheezing.   Cardiovascular: Negative for chest pain and palpitations.  Gastrointestinal: Positive for abdominal pain. Negative for blood in stool, diarrhea, nausea and vomiting.  Genitourinary: Negative for dysuria, frequency and hematuria.  Musculoskeletal: Negative for arthralgias, back pain and neck pain.  Skin: Negative.   Neurological: Negative for dizziness, seizures and speech difficulty.  Psychiatric/Behavioral: Negative for confusion and hallucinations.     Physical Exam Updated Vital Signs BP (!) 140/91 (BP Location: Right Arm)   Pulse 68   Temp 97.6 F (36.4 C) (Oral)   Resp 18   Wt 73.9 kg   SpO2 100%   BMI 25.91 kg/m   Physical Exam Vitals signs and nursing note reviewed.  Constitutional:  Appearance: He is well-developed. He is not toxic-appearing.  HENT:     Head: Normocephalic.     Right Ear: Tympanic membrane and external ear normal.     Left Ear: Tympanic membrane and external ear normal.  Eyes:     General: Lids are normal.     Pupils: Pupils are equal, round, and reactive to light.  Neck:     Musculoskeletal: Normal range of motion and neck supple.     Vascular: No carotid bruit.  Cardiovascular:     Rate and Rhythm: Normal rate and regular rhythm.     Pulses: Normal pulses.     Heart sounds: Normal heart sounds.  Pulmonary:     Effort: No respiratory distress.     Breath sounds: Normal breath  sounds.  Abdominal:     General: Bowel sounds are normal.     Palpations: Abdomen is soft.     Tenderness: There is abdominal tenderness in the epigastric area and periumbilical area. There is no guarding.     Comments: Left flank tenderness with palpation and attempted ROM.  Genitourinary:    Rectum: Normal. Guaiac result negative.  Musculoskeletal: Normal range of motion.  Lymphadenopathy:     Head:     Right side of head: No submandibular adenopathy.     Left side of head: No submandibular adenopathy.     Cervical: No cervical adenopathy.  Skin:    General: Skin is warm and dry.  Neurological:     Mental Status: He is alert and oriented to person, place, and time.     Cranial Nerves: No cranial nerve deficit.     Sensory: No sensory deficit.  Psychiatric:        Speech: Speech normal.      ED Treatments / Results  Labs (all labs ordered are listed, but only abnormal results are displayed) Labs Reviewed  COMPREHENSIVE METABOLIC PANEL - Abnormal; Notable for the following components:      Result Value   Glucose, Bld 148 (*)    Total Protein 8.5 (*)    All other components within normal limits  LIPASE, BLOOD  CBC  URINALYSIS, ROUTINE W REFLEX MICROSCOPIC  POC OCCULT BLOOD, ED    EKG None  Radiology No results found.  Procedures Procedures (including critical care time)  Medications Ordered in ED Medications  morphine 4 MG/ML injection 4 mg (has no administration in time range)  ondansetron (ZOFRAN) injection 4 mg (has no administration in time range)     Initial Impression / Assessment and Plan / ED Course  I have reviewed the triage vital signs and the nursing notes.  Pertinent labs & imaging results that were available during my care of the patient were reviewed by me and considered in my medical decision making (see chart for details).        Final Clinical Impressions(s) / ED Diagnoses MDM  Vital signs reviewed.  Pulse oximetry is 100% on room  air.  Within normal limits by my interpretation.  Patient reports upper abdomen pain on examination he has epigastric area pain and periumbilical area pain.  Stool is negative for occult blood.  Lab work is nonacute.  Will obtain a CT scan.  CT scan question some abnormality of the gallbladder.  Patient treated for pain and nausea.  I have scheduled the patient for an outpatient ultrasound to further evaluate the gallbladder area.  Prescription for Augmentin and Norco given to the patient.  I have explained to the patient  through his wife who is interpreting that he should call for an appointment to have his ultrasound done first thing tomorrow morning.  I have asked her to return to the emergency department if any changes or problems.  If the ultrasound is positive for biliary tree problems, the patient should be referred to surgery, or GI.  If the scan is negative, the patient should be referred to GI for additional evaluation and work-up.  Patient and patient's wife acknowledge understanding of the plan at this point.   Final diagnoses:  Epigastric abdominal pain  Calculus of gallbladder without cholecystitis without obstruction    ED Discharge Orders         Ordered    amoxicillin-clavulanate (AUGMENTIN) 875-125 MG tablet  Every 12 hours     11/05/18 2024    HYDROcodone-acetaminophen (NORCO/VICODIN) 5-325 MG tablet  Every 4 hours PRN     11/05/18 2024    ondansetron (ZOFRAN) 4 MG tablet  Every 6 hours     11/05/18 2024    US Abdomen Limited RUQ/Gall Gladder     11/05/18 2022           Ivery Quale, PA-C 11/07/18 0127    Linwood Dibbles, MD 11/08/18 1134

## 2018-11-05 NOTE — ED Triage Notes (Signed)
Pt c/o upper abdominal pain that radiates to LT side and back that began this morning. Denies n/v/d or urinary problems. Pt is Spanish speaking. Wife with him to translate.

## 2018-11-05 NOTE — ED Notes (Signed)
Patient made aware of CT status.

## 2018-11-05 NOTE — ED Notes (Signed)
Fecal occult test results not crossing over with new patient override. Fecal occult negative.

## 2018-11-05 NOTE — Discharge Instructions (Addendum)
If you develop any worsening/uncontrolled pain, vomiting, fever, or any other new/concerning symptoms then return to the ER immediately. Otherwise follow up with the surgeon on Thursday, 11/08/18 at 10:30 AM  Continue to take the antibiotics.   Si desarrolla un empeoramiento / dolor incontrolado, vmitos, fiebre o cualquier otro sntoma nuevo / preocupante, regrese a la sala de emergencias de inmediato. De lo contrario, haga un seguimiento con el cirujano el jueves 19/12/19 a las 10:30 a.m.  Contina tomando los antibiticos.

## 2018-11-06 ENCOUNTER — Ambulatory Visit (HOSPITAL_COMMUNITY)
Admission: RE | Admit: 2018-11-06 | Discharge: 2018-11-06 | Disposition: A | Discharge status: Home / Self Care | Payer: Self-pay | Source: Ambulatory Visit | Attending: Physician Assistant | Admitting: Physician Assistant

## 2018-11-06 DIAGNOSIS — K828 Other specified diseases of gallbladder: Secondary | ICD-10-CM | POA: Insufficient documentation

## 2018-11-06 DIAGNOSIS — R1033 Periumbilical pain: Secondary | ICD-10-CM | POA: Insufficient documentation

## 2018-11-06 DIAGNOSIS — R1013 Epigastric pain: Secondary | ICD-10-CM | POA: Insufficient documentation

## 2018-11-06 DIAGNOSIS — K8020 Calculus of gallbladder without cholecystitis without obstruction: Secondary | ICD-10-CM | POA: Insufficient documentation

## 2018-11-06 NOTE — ED Provider Notes (Signed)
2:20 PM Patient's ultrasound has resulted.  There are numerous gallbladder stones and slightly thickened gallbladder wall.  I discussed with patient and he still having a little bit of epigastric pain but he rates as about a 3 out of 10.  Labs from yesterday were pretty unremarkable.  I discussed his case and the CT scan from yesterday with Dr. Lovell Sheehan of surgery.  He recommends that given the patient is not in distress and overall appears okay, that he could follow-up in his office on Thursday, 12/19 at 10:30 AM.  Likely will have his gallbladder taken out next day.  Continue the antibiotics that were prescribed last night.  We discussed that should he develop any other new/concerning symptoms such as fever, vomiting, or worsening/uncontrolled pain, he should not wait and should instead come back to the ER.  He and family verbalized understanding.   Results for orders placed or performed during the hospital encounter of 11/05/18  Lipase, blood  Result Value Ref Range   Lipase 38 11 - 51 U/L  Comprehensive metabolic panel  Result Value Ref Range   Sodium 141 135 - 145 mmol/L   Potassium 4.7 3.5 - 5.1 mmol/L   Chloride 111 98 - 111 mmol/L   CO2 23 22 - 32 mmol/L   Glucose, Bld 148 (H) 70 - 99 mg/dL   BUN 15 6 - 20 mg/dL   Creatinine, Ser 7.82 0.61 - 1.24 mg/dL   Calcium 9.1 8.9 - 95.6 mg/dL   Total Protein 8.5 (H) 6.5 - 8.1 g/dL   Albumin 4.3 3.5 - 5.0 g/dL   AST 24 15 - 41 U/L   ALT 29 0 - 44 U/L   Alkaline Phosphatase 67 38 - 126 U/L   Total Bilirubin 0.7 0.3 - 1.2 mg/dL   GFR calc non Af Amer >60 >60 mL/min   GFR calc Af Amer >60 >60 mL/min   Anion gap 7 5 - 15  CBC  Result Value Ref Range   WBC 9.2 4.0 - 10.5 K/uL   RBC 4.88 4.22 - 5.81 MIL/uL   Hemoglobin 15.7 13.0 - 17.0 g/dL   HCT 21.3 08.6 - 57.8 %   MCV 96.1 80.0 - 100.0 fL   MCH 32.2 26.0 - 34.0 pg   MCHC 33.5 30.0 - 36.0 g/dL   RDW 46.9 62.9 - 52.8 %   Platelets 207 150 - 400 K/uL   nRBC 0.0 0.0 - 0.2 %  Urinalysis,  Routine w reflex microscopic  Result Value Ref Range   Color, Urine YELLOW YELLOW   APPearance CLEAR CLEAR   Specific Gravity, Urine 1.027 1.005 - 1.030   pH 7.0 5.0 - 8.0   Glucose, UA NEGATIVE NEGATIVE mg/dL   Hgb urine dipstick NEGATIVE NEGATIVE   Bilirubin Urine NEGATIVE NEGATIVE   Ketones, ur NEGATIVE NEGATIVE mg/dL   Protein, ur NEGATIVE NEGATIVE mg/dL   Nitrite NEGATIVE NEGATIVE   Leukocytes, UA NEGATIVE NEGATIVE   Ct Abdomen Pelvis W Contrast  Result Date: 11/05/2018 CLINICAL DATA:  RIGHT upper quadrant pain the radiates LEFT side. EXAM: CT ABDOMEN AND PELVIS WITH CONTRAST TECHNIQUE: Multidetector CT imaging of the abdomen and pelvis was performed using the standard protocol following bolus administration of intravenous contrast. CONTRAST:  ISOVUE-300 IOPAMIDOL (ISOVUE-300) INJECTION 61% COMPARISON:  CT 04/16/2011 FINDINGS: Lower chest: Lung bases are clear. Hepatobiliary: No focal hepatic lesion. No intrahepatic biliary duct dilatation. Gallbladder is nondistended measuring 3 cm in diameter; however, there is pericholecystic fluid (image 30/2). There is  mild hyperemia of the gallbladder wall. No echogenic gallstones are present. Common bile duct is normal caliber. Pancreas: Pancreas is normal. No ductal dilatation. No pancreatic inflammation. Spleen: Normal spleen Adrenals/urinary tract: Adrenal glands and kidneys are normal. The ureters and bladder normal. Stomach/Bowel: Stomach, small bowel, appendix, and cecum are normal. Diverticula of the descending colon sigmoid colon without acute inflammation. Vascular/Lymphatic: Abdominal aorta is normal caliber. No periportal or retroperitoneal adenopathy. No pelvic adenopathy. Reproductive: Prostate normal Other: No free fluid. Musculoskeletal: No aggressive osseous lesion. 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 Small amount of fluid surrounding the gallbladder. Recommend clinical correlation  for acute cholecystitis. No radiodense gallstones are present. 2. Normal appendix. 3. No obstructive uropathy. 4. Descending colon sigmoid colon diverticulosis without evidence of diverticulitis. 5. 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 Electronically Signed   By: Genevive BiStewart  Edmunds M.D.   On: 11/05/2018 19:53   [place] Abdomen Limited Ruq/gall Gladder  Result Date: 11/06/2018 CLINICAL DATA:  Epigastric and periumbilical pain. EXAM: ULTRASOUND ABDOMEN LIMITED RIGHT UPPER QUADRANT COMPARISON:  None. FINDINGS: Gallbladder: Multiple shadowing gallstones with wall echo shadow sign. No focal tenderness, but there is wall thickening to 5 mm. No visible pericholecystic edema today, but there was pericholecystic fluid by CT yesterday. Common bile duct: Diameter: 4 mm Liver: No focal lesion identified. Within normal limits in parenchymal echogenicity. Portal vein is patent on color Doppler imaging with normal direction of blood flow towards the liver. IMPRESSION: 1. Numerous gallstones filling the gallbladder and obscuring the posterior gallbladder wall. 2. No sonographic Murphy sign but there is gallbladder wall thickening and pericholecystic edema by CT yesterday, cholecystitis is not excluded. Electronically Signed   By: Marnee SpringJonathon  Watts M.D.   On: 11/06/2018 13:33     Results for orders placed or performed during the hospital encounter of 11/05/18  Lipase, blood  Result Value Ref Range   Lipase 38 11 - 51 U/L  Comprehensive metabolic panel  Result Value Ref Range   Sodium 141 135 - 145 mmol/L   Potassium 4.7 3.5 - 5.1 mmol/L   Chloride 111 98 - 111 mmol/L   CO2 23 22 - 32 mmol/L   Glucose, Bld 148 (H) 70 - 99 mg/dL   BUN 15 6 - 20 mg/dL   Creatinine, Ser 3.080.85 0.61 - 1.24 mg/dL   Calcium 9.1 8.9 - 65.710.3 mg/dL   Total Protein 8.5 (H) 6.5 - 8.1 g/dL   Albumin 4.3 3.5 - 5.0 g/dL   AST 24 15 - 41 U/L   ALT 29 0 - 44 U/L   Alkaline Phosphatase 67 38 - 126 U/L   Total Bilirubin 0.7 0.3 -  1.2 mg/dL   GFR calc non Af Amer >60 >60 mL/min   GFR calc Af Amer >60 >60 mL/min   Anion gap 7 5 - 15  CBC  Result Value Ref Range   WBC 9.2 4.0 - 10.5 K/uL   RBC 4.88 4.22 - 5.81 MIL/uL   Hemoglobin 15.7 13.0 - 17.0 g/dL   HCT 84.646.9 96.239.0 - 95.252.0 %   MCV 96.1 80.0 - 100.0 fL   MCH 32.2 26.0 - 34.0 pg   MCHC 33.5 30.0 - 36.0 g/dL   RDW 84.113.5 32.411.5 - 40.115.5 %   Platelets 207 150 - 400 K/uL   nRBC 0.0 0.0 - 0.2 %  Urinalysis, Routine w reflex microscopic  Result Value Ref Range   Color, Urine YELLOW YELLOW  APPearance CLEAR CLEAR   Specific Gravity, Urine 1.027 1.005 - 1.030   pH 7.0 5.0 - 8.0   Glucose, UA NEGATIVE NEGATIVE mg/dL   Hgb urine dipstick NEGATIVE NEGATIVE   Bilirubin Urine NEGATIVE NEGATIVE   Ketones, ur NEGATIVE NEGATIVE mg/dL   Protein, ur NEGATIVE NEGATIVE mg/dL   Nitrite NEGATIVE NEGATIVE   Leukocytes, UA NEGATIVE NEGATIVE   Ct Abdomen Pelvis W Contrast  Result Date: 11/05/2018 CLINICAL DATA:  RIGHT upper quadrant pain the radiates LEFT side. EXAM: CT ABDOMEN AND PELVIS WITH CONTRAST TECHNIQUE: Multidetector CT imaging of the abdomen and pelvis was performed using the standard protocol following bolus administration of intravenous contrast. CONTRAST:  ISOVUE-300 IOPAMIDOL (ISOVUE-300) INJECTION 61% COMPARISON:  CT 04/16/2011 FINDINGS: Lower chest: Lung bases are clear. Hepatobiliary: No focal hepatic lesion. No intrahepatic biliary duct dilatation. Gallbladder is nondistended measuring 3 cm in diameter; however, there is pericholecystic fluid (image 30/2). There is mild hyperemia of the gallbladder wall. No echogenic gallstones are present. Common bile duct is normal caliber. Pancreas: Pancreas is normal. No ductal dilatation. No pancreatic inflammation. Spleen: Normal spleen Adrenals/urinary tract: Adrenal glands and kidneys are normal. The ureters and bladder normal. Stomach/Bowel: Stomach, small bowel, appendix, and cecum are normal. Diverticula of the descending  colon sigmoid colon without acute inflammation. Vascular/Lymphatic: Abdominal aorta is normal caliber. No periportal or retroperitoneal adenopathy. No pelvic adenopathy. Reproductive: Prostate normal Other: No free fluid. Musculoskeletal: No aggressive osseous lesion. 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 Small amount of fluid surrounding the gallbladder. Recommend clinical correlation for acute cholecystitis. No radiodense gallstones are present. 2. Normal appendix. 3. No obstructive uropathy. 4. Descending colon sigmoid colon diverticulosis without evidence of diverticulitis. 5. 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 Electronically Signed   By: Genevive Bi M.D.   On: 11/05/2018 19:53   US Abdomen Limited Ruq/gall Gladder  Result Date: 11/06/2018 CLINICAL DATA:  Epigastric and periumbilical pain. EXAM: ULTRASOUND ABDOMEN LIMITED RIGHT UPPER QUADRANT COMPARISON:  None. FINDINGS: Gallbladder: Multiple shadowing gallstones with wall echo shadow sign. No focal tenderness, but there is wall thickening to 5 mm. No visible pericholecystic edema today, but there was pericholecystic fluid by CT yesterday. Common bile duct: Diameter: 4 mm Liver: No focal lesion identified. Within normal limits in parenchymal echogenicity. Portal vein is patent on color Doppler imaging with normal direction of blood flow towards the liver. IMPRESSION: 1. Numerous gallstones filling the gallbladder and obscuring the posterior gallbladder wall. 2. No sonographic Murphy sign but there is gallbladder wall thickening and pericholecystic edema by CT yesterday, cholecystitis is not excluded. Electronically Signed   By: Marnee Spring M.D.   On: 11/06/2018 13:33      Pricilla Loveless, MD 11/06/18 1536

## 2018-11-07 LAB — POC OCCULT BLOOD, ED: Fecal Occult Bld: NEGATIVE

## 2018-11-08 ENCOUNTER — Other Ambulatory Visit: Payer: Self-pay

## 2018-11-08 ENCOUNTER — Ambulatory Visit: Payer: Self-pay | Admitting: General Surgery

## 2018-11-08 ENCOUNTER — Observation Stay (HOSPITAL_COMMUNITY)
Admission: EM | Admit: 2018-11-08 | Discharge: 2018-11-10 | Disposition: A | Discharge status: Home / Self Care | Payer: Self-pay | Attending: General Surgery | Admitting: General Surgery

## 2018-11-08 ENCOUNTER — Encounter (HOSPITAL_COMMUNITY): Payer: Self-pay | Admitting: Emergency Medicine

## 2018-11-08 DIAGNOSIS — K810 Acute cholecystitis: Secondary | ICD-10-CM

## 2018-11-08 DIAGNOSIS — K8000 Calculus of gallbladder with acute cholecystitis without obstruction: Secondary | ICD-10-CM | POA: Diagnosis present

## 2018-11-08 DIAGNOSIS — Z79899 Other long term (current) drug therapy: Secondary | ICD-10-CM | POA: Insufficient documentation

## 2018-11-08 DIAGNOSIS — Z87891 Personal history of nicotine dependence: Secondary | ICD-10-CM | POA: Insufficient documentation

## 2018-11-08 DIAGNOSIS — K8012 Calculus of gallbladder with acute and chronic cholecystitis without obstruction: Principal | ICD-10-CM | POA: Insufficient documentation

## 2018-11-08 DIAGNOSIS — K219 Gastro-esophageal reflux disease without esophagitis: Secondary | ICD-10-CM | POA: Insufficient documentation

## 2018-11-08 HISTORY — DX: Gastro-esophageal reflux disease without esophagitis: K21.9

## 2018-11-08 LAB — CBC WITH DIFFERENTIAL/PLATELET
Abs Immature Granulocytes: 0.02 10*3/uL (ref 0.00–0.07)
BASOS PCT: 1 %
Basophils Absolute: 0.1 10*3/uL (ref 0.0–0.1)
Eosinophils Absolute: 0.2 10*3/uL (ref 0.0–0.5)
Eosinophils Relative: 2 %
HCT: 47 % (ref 39.0–52.0)
HEMOGLOBIN: 16 g/dL (ref 13.0–17.0)
IMMATURE GRANULOCYTES: 0 %
LYMPHS PCT: 14 %
Lymphs Abs: 1.3 10*3/uL (ref 0.7–4.0)
MCH: 32.6 pg (ref 26.0–34.0)
MCHC: 34 g/dL (ref 30.0–36.0)
MCV: 95.7 fL (ref 80.0–100.0)
MONOS PCT: 6 %
Monocytes Absolute: 0.5 10*3/uL (ref 0.1–1.0)
NEUTROS ABS: 7.2 10*3/uL (ref 1.7–7.7)
NEUTROS PCT: 77 %
PLATELETS: 204 10*3/uL (ref 150–400)
RBC: 4.91 MIL/uL (ref 4.22–5.81)
RDW: 13.1 % (ref 11.5–15.5)
WBC: 9.3 10*3/uL (ref 4.0–10.5)
nRBC: 0 % (ref 0.0–0.2)

## 2018-11-08 LAB — COMPREHENSIVE METABOLIC PANEL
ALBUMIN: 4.3 g/dL (ref 3.5–5.0)
ALT: 32 U/L (ref 0–44)
AST: 24 U/L (ref 15–41)
Alkaline Phosphatase: 69 U/L (ref 38–126)
Anion gap: 8 (ref 5–15)
BUN: 12 mg/dL (ref 6–20)
CHLORIDE: 100 mmol/L (ref 98–111)
CO2: 26 mmol/L (ref 22–32)
Calcium: 9.5 mg/dL (ref 8.9–10.3)
Creatinine, Ser: 0.87 mg/dL (ref 0.61–1.24)
GFR calc Af Amer: >60 mL/min (ref >60)
GFR calc non Af Amer: >60 mL/min (ref >60)
GLUCOSE: 128 mg/dL — AB (ref 70–99)
POTASSIUM: 4.5 mmol/L (ref 3.5–5.1)
SODIUM: 134 mmol/L — AB (ref 135–145)
Total Bilirubin: 0.6 mg/dL (ref 0.3–1.2)
Total Protein: 8.7 g/dL — ABNORMAL HIGH (ref 6.5–8.1)

## 2018-11-08 LAB — SURGICAL PCR SCREEN
MRSA, PCR: NEGATIVE
Staphylococcus aureus: NEGATIVE

## 2018-11-08 LAB — LIPASE, BLOOD: LIPASE: 34 U/L (ref 11–51)

## 2018-11-08 MED ORDER — OXYCODONE-ACETAMINOPHEN 5-325 MG PO TABS: 1.0000 | ORAL_TABLET | ORAL | Status: DC | PRN

## 2018-11-08 MED ORDER — DIPHENHYDRAMINE HCL 50 MG/ML IJ SOLN: 25.0000 mg | Freq: Four times a day (QID) | INTRAMUSCULAR | Status: DC | PRN

## 2018-11-08 MED ORDER — ONDANSETRON HCL 4 MG/2ML IJ SOLN
4.0000 mg | Freq: Once | INTRAMUSCULAR | Status: AC
  Administered 2018-11-08: 4 mg via INTRAVENOUS
  Filled 2018-11-08: qty 2

## 2018-11-08 MED ORDER — CHLORHEXIDINE GLUCONATE CLOTH 2 % EX PADS
6.0000 | MEDICATED_PAD | Freq: Once | CUTANEOUS | Status: AC
  Administered 2018-11-08: 6 via TOPICAL

## 2018-11-08 MED ORDER — SIMETHICONE 80 MG PO CHEW: 40.0000 mg | CHEWABLE_TABLET | Freq: Four times a day (QID) | ORAL | Status: DC | PRN

## 2018-11-08 MED ORDER — CHLORHEXIDINE GLUCONATE CLOTH 2 % EX PADS: 6.0000 | MEDICATED_PAD | Freq: Once | CUTANEOUS | Status: DC

## 2018-11-08 MED ORDER — DIPHENHYDRAMINE HCL 25 MG PO CAPS: 25.0000 mg | ORAL_CAPSULE | Freq: Four times a day (QID) | ORAL | Status: DC | PRN

## 2018-11-08 MED ORDER — MUPIROCIN 2 % EX OINT: 1.0000 "application " | TOPICAL_OINTMENT | Freq: Two times a day (BID) | CUTANEOUS | Status: DC

## 2018-11-08 MED ORDER — ACETAMINOPHEN 325 MG PO TABS: 650.0000 mg | ORAL_TABLET | Freq: Four times a day (QID) | ORAL | Status: DC | PRN

## 2018-11-08 MED ORDER — ACETAMINOPHEN 650 MG RE SUPP: 650.0000 mg | Freq: Four times a day (QID) | RECTAL | Status: DC | PRN

## 2018-11-08 MED ORDER — HYDROMORPHONE HCL 1 MG/ML IJ SOLN
1.0000 mg | INTRAMUSCULAR | Status: DC | PRN
  Administered 2018-11-08: 1 mg via INTRAVENOUS
  Filled 2018-11-08: qty 1

## 2018-11-08 MED ORDER — ONDANSETRON HCL 4 MG/2ML IJ SOLN: 4.0000 mg | Freq: Four times a day (QID) | INTRAMUSCULAR | Status: DC | PRN

## 2018-11-08 MED ORDER — SODIUM CHLORIDE 0.9 % IV SOLN
2.0000 g | INTRAVENOUS | Status: DC
  Administered 2018-11-08 – 2018-11-09 (×2): 2 g via INTRAVENOUS
  Filled 2018-11-08 (×2): qty 20
  Filled 2018-11-08 (×2): qty 2

## 2018-11-08 MED ORDER — SODIUM CHLORIDE 0.9 % IV SOLN
INTRAVENOUS | Status: DC
  Administered 2018-11-08 – 2018-11-09 (×2): via INTRAVENOUS

## 2018-11-08 MED ORDER — ONDANSETRON 4 MG PO TBDP: 4.0000 mg | ORAL_TABLET | Freq: Four times a day (QID) | ORAL | Status: DC | PRN

## 2018-11-08 MED ORDER — HYDROMORPHONE HCL 1 MG/ML IJ SOLN
1.0000 mg | Freq: Once | INTRAMUSCULAR | Status: AC
  Administered 2018-11-08: 1 mg via INTRAVENOUS
  Filled 2018-11-08: qty 1

## 2018-11-08 NOTE — H&P (Signed)
Arthur Walker is an 37 y.o. male.   Chief Complaint: Right upper quadrant abdominal pain HPI: Patient is a 37 year old Hispanic male who presented back to the emergency room this morning with worsening right upper quadrant abdominal pain.  He had been seen several days ago in the emergency room and was diagnosed with acute cholecystitis with cholelithiasis.  He was supposed to see me in my office today, but he ended up in the emergency room.  He has had intermittent episodes of right upper quadrant abdominal pain with radiation to the right flank and nausea.  No emesis has been noted.  He states that this has been occurring intermittently for the past few weeks.  He denies any fever, chills, jaundice.  History was obtained from his wife.  He currently has 3 out of 10 abdominal pain, after receiving Dilaudid.  History reviewed. No pertinent past medical history.  History reviewed. No pertinent surgical history.  Family History  Problem Relation Age of Onset  . Diabetes Mother   . Hypertension Mother   . Diabetes Father   . Diabetes Maternal Uncle   . Hypertension Maternal Uncle    Social History:  reports that he quit smoking about 21 months ago. His smoking use included cigarettes. He has a 4.25 pack-year smoking history. He has never used smokeless tobacco. He reports current alcohol use of about 2.0 standard drinks of alcohol per week. He reports that he does not use drugs.  Allergies: No Known Allergies  (Not in a hospital admission)   Results for orders placed or performed during the hospital encounter of 11/08/18 (from the past 48 hour(s))  Comprehensive metabolic panel     Status: Abnormal   Collection Time: 11/08/18 10:07 AM  Result Value Ref Range   Sodium 134 (L) 135 - 145 mmol/L   Potassium 4.5 3.5 - 5.1 mmol/L   Chloride 100 98 - 111 mmol/L   CO2 26 22 - 32 mmol/L   Glucose, Bld 128 (H) 70 - 99 mg/dL   BUN 12 6 - 20 mg/dL   Creatinine, Ser 6.040.87 0.61 - 1.24 mg/dL   Calcium 9.5 8.9 - 54.010.3 mg/dL   Total Protein 8.7 (H) 6.5 - 8.1 g/dL   Albumin 4.3 3.5 - 5.0 g/dL   AST 24 15 - 41 U/L   ALT 32 0 - 44 U/L   Alkaline Phosphatase 69 38 - 126 U/L   Total Bilirubin 0.6 0.3 - 1.2 mg/dL   GFR calc non Af Amer >60 >60 mL/min   GFR calc Af Amer >60 >60 mL/min   Anion gap 8 5 - 15    Comment: Performed at [facility], [address] [place], [place] [address]  Lipase, blood     Status: None   Collection Time: 11/08/18 10:07 AM  Result Value Ref Range   Lipase 34 11 - 51 U/L    Comment: Performed at [facility], [address] [place], [place] [address]  CBC with Differential     Status: None   Collection Time: 11/08/18 10:07 AM  Result Value Ref Range   WBC 9.3 4.0 - 10.5 K/uL   RBC 4.91 4.22 - 5.81 MIL/uL   Hemoglobin 16.0 13.0 - 17.0 g/dL   HCT 95.647.0 21.339.0 - 08.652.0 %   MCV 95.7 80.0 - 100.0 fL   MCH 32.6 26.0 - 34.0 pg   MCHC 34.0 30.0 - 36.0 g/dL   RDW 57.813.1 46.911.5 - 62.915.5 %   Platelets 204 150 -  400 K/uL   nRBC 0.0 0.0 - 0.2 %   Neutrophils Relative % 77 %   Neutro Abs 7.2 1.7 - 7.7 K/uL   Lymphocytes Relative 14 %   Lymphs Abs 1.3 0.7 - 4.0 K/uL   Monocytes Relative 6 %   Monocytes Absolute 0.5 0.1 - 1.0 K/uL   Eosinophils Relative 2 %   Eosinophils Absolute 0.2 0.0 - 0.5 K/uL   Basophils Relative 1 %   Basophils Absolute 0.1 0.0 - 0.1 K/uL   Immature Granulocytes 0 %   Abs Immature Granulocytes 0.02 0.00 - 0.07 K/uL    Comment: Performed at [facility], [address] [place], [place] [address]   [place] Abdomen Limited Ruq/gall Gladder  Result Date: 11/06/2018 CLINICAL DATA:  Epigastric and periumbilical pain. EXAM: ULTRASOUND ABDOMEN LIMITED RIGHT UPPER QUADRANT COMPARISON:  None. FINDINGS: Gallbladder: Multiple shadowing gallstones with wall echo shadow sign. No focal tenderness, but there is wall thickening to 5 mm. No visible pericholecystic edema today, but there was pericholecystic fluid by CT yesterday. Common bile duct: Diameter: 4 mm  Liver: No focal lesion identified. Within normal limits in parenchymal echogenicity. Portal vein is patent on color Doppler imaging with normal direction of blood flow towards the liver. IMPRESSION: 1. Numerous gallstones filling the gallbladder and obscuring the posterior gallbladder wall. 2. No sonographic Murphy sign but there is gallbladder wall thickening and pericholecystic edema by CT yesterday, cholecystitis is not excluded. Electronically Signed   By: Marnee SpringJonathon  Watts M.D.   On: 11/06/2018 13:33    Review of Systems  Constitutional: Negative.   HENT: Negative.   Eyes: Negative.   Respiratory: Negative.   Cardiovascular: Negative.   Gastrointestinal: Positive for abdominal pain.  Genitourinary: Negative.   Musculoskeletal: Negative.   Skin: Negative.   Neurological: Negative.   Endo/Heme/Allergies: Negative.   Psychiatric/Behavioral: Negative.     Blood pressure 129/90, pulse 64, temperature 98 F (36.7 C), temperature source Oral, resp. rate 14, height 5\' 6"  (1.676 m), weight 73.9 kg, SpO2 96 %. Physical Exam  Vitals reviewed. Constitutional: He is oriented to person, place, and time. He appears well-developed and well-nourished. No distress.  HENT:  Head: Normocephalic and atraumatic.  Eyes: No scleral icterus.  Cardiovascular: Normal rate, regular rhythm and normal heart sounds. Exam reveals no gallop.  No murmur heard. Respiratory: Effort normal and breath sounds normal. No respiratory distress. He has no wheezes. He has no rales.  GI: Soft. Bowel sounds are normal. He exhibits no distension. There is abdominal tenderness. There is no rebound and no guarding.  Tender in the right upper quadrant to palpation.  Neurological: He is alert and oriented to person, place, and time.  Skin: Skin is warm and dry.    Ultrasound report reviewed.  ER notes reviewed. Assessment/Plan Impression: Acute cholecystitis, cholelithiasis Plan: Patient will be brought into the hospital to  control his pain.  He subsequently will undergo laparoscopic cholecystectomy.  The risks and benefits of the procedure including bleeding, infection, hepatobiliary injury, and the possibility of an open procedure were fully explained to the patient by his wife, who gave informed consent.  His consent will be done in Spanish.  IV Rocephin has been prescribed.  Franky MachoMark Karmah Potocki, MD 11/08/2018, 12:46 PM

## 2018-11-08 NOTE — ED Provider Notes (Signed)
[facility] EMERGENCY DEPARTMENT Provider Note   CSN: [id number] Arrival date & time: 11/08/18  0844     History   Chief Complaint Chief Complaint  Patient presents with  . Abdominal Pain    HPI Arthur Walker is a 37 y.o. male with a history of GERD, hyperlipidemia and known gallbladder disease from a recent evaluation here just 3 days ago presenting with acute exacerbation of his right upper quadrant pain which gradually increased last night.  He reports nausea without emesis and has had no fevers.  He denies chest pain, shortness of breath, dysuria and has had no constipation or diarrhea.  His last p.o. intake was yesterday evening, had a small bowl of soup after which he had gradual worsening pain.  He was scheduled to see Dr. Lovell Sheehan in his office this morning in anticipation of a probable cholecystectomy to occur tomorrow.  With his increased pain he resented here instead.  The history is provided by the patient and the spouse. The history is limited by a language barrier. A language interpreter was used (wife at bedside).    History reviewed. No pertinent past medical history.  Patient Active Problem List   Diagnosis Date Noted  . Hyperlipidemia 05/29/2017  . Gastroesophageal reflux disease 01/30/2017  . Lung nodule seen on imaging study 01/30/2017  . Cigarette nicotine dependence without complication 01/02/2017    History reviewed. No pertinent surgical history.      Home Medications    Prior to Admission medications   Medication Sig Start Date End Date Taking? Authorizing Provider  amoxicillin-clavulanate (AUGMENTIN) 875-125 MG tablet Take 1 tablet by mouth every 12 (twelve) hours. 11/05/18   Ivery Quale, PA-C  HYDROcodone-acetaminophen (NORCO/VICODIN) 5-325 MG tablet Take 1-2 tablets by mouth every 4 (four) hours as needed. 11/05/18   Ivery Quale, PA-C  omeprazole (PRILOSEC) 20 MG capsule Take 1 capsule (20 mg total) by mouth daily. 09/03/18    Benjiman Core, MD  ondansetron (ZOFRAN) 4 MG tablet Take 1 tablet (4 mg total) by mouth every 6 (six) hours. 11/05/18   Ivery Quale, PA-C    Family History Family History  Problem Relation Age of Onset  . Diabetes Mother   . Hypertension Mother   . Diabetes Father   . Diabetes Maternal Uncle   . Hypertension Maternal Uncle     Social History Social History   Tobacco Use  . Smoking status: Former Smoker    Packs/day: 0.25    Years: 17.00    Pack years: 4.25    Types: Cigarettes    Last attempt to quit: 01/30/2017    Years since quitting: 1.7  . Smokeless tobacco: Never Used  Substance Use Topics  . Alcohol use: Yes    Alcohol/week: 2.0 standard drinks    Types: 2 Cans of beer per week    Comment: 2  32oz beer daily  . Drug use: No     Allergies   Patient has no known allergies.   Review of Systems Review of Systems  Constitutional: Positive for appetite change. Negative for chills and fever.  HENT: Negative for congestion and sore throat.   Eyes: Negative.   Respiratory: Negative for chest tightness and shortness of breath.   Cardiovascular: Negative for chest pain.  Gastrointestinal: Positive for abdominal pain and nausea. Negative for vomiting.  Genitourinary: Negative.   Musculoskeletal: Negative for arthralgias, joint swelling and neck pain.  Skin: Negative.  Negative for rash and wound.  Neurological: Negative for dizziness, weakness, light-headedness,  numbness and headaches.  Psychiatric/Behavioral: Negative.      Physical Exam Updated Vital Signs BP (!) 125/97 (BP Location: Left Arm)   Pulse 78   Temp 98 F (36.7 C) (Oral)   Resp 14   Ht 5\' 6"  (1.676 m)   Wt 73.9 kg   SpO2 100%   BMI 26.31 kg/m   Physical Exam Vitals signs and nursing note reviewed.  Constitutional:      General: He is in acute distress.     Appearance: He is well-developed.     Comments: Obvious discomfort.  HENT:     Head: Normocephalic and atraumatic.  Eyes:       General: No scleral icterus.    Conjunctiva/sclera: Conjunctivae normal.  Neck:     Musculoskeletal: Normal range of motion.  Cardiovascular:     Rate and Rhythm: Normal rate and regular rhythm.     Heart sounds: Normal heart sounds.  Pulmonary:     Effort: Pulmonary effort is normal.     Breath sounds: Normal breath sounds. No wheezing.  Abdominal:     General: Bowel sounds are normal. There is no distension.     Palpations: Abdomen is soft.     Tenderness: There is abdominal tenderness in the right upper quadrant. There is guarding. There is no right CVA tenderness or left CVA tenderness.  Musculoskeletal: Normal range of motion.  Skin:    General: Skin is warm and dry.  Neurological:     Mental Status: He is alert.      ED Treatments / Results  Labs (all labs ordered are listed, but only abnormal results are displayed) Labs Reviewed  COMPREHENSIVE METABOLIC PANEL - Abnormal; Notable for the following components:      Result Value   Sodium 134 (*)    Glucose, Bld 128 (*)    Total Protein 8.7 (*)    All other components within normal limits  LIPASE, BLOOD  CBC WITH DIFFERENTIAL/PLATELET    EKG None  Radiology [place] Abdomen Limited Ruq/gall Gladder  Result Date: 11/06/2018 CLINICAL DATA:  Epigastric and periumbilical pain. EXAM: ULTRASOUND ABDOMEN LIMITED RIGHT UPPER QUADRANT COMPARISON:  None. FINDINGS: Gallbladder: Multiple shadowing gallstones with wall echo shadow sign. No focal tenderness, but there is wall thickening to 5 mm. No visible pericholecystic edema today, but there was pericholecystic fluid by CT yesterday. Common bile duct: Diameter: 4 mm Liver: No focal lesion identified. Within normal limits in parenchymal echogenicity. Portal vein is patent on color Doppler imaging with normal direction of blood flow towards the liver. IMPRESSION: 1. Numerous gallstones filling the gallbladder and obscuring the posterior gallbladder wall. 2. No sonographic Murphy sign  but there is gallbladder wall thickening and pericholecystic edema by CT yesterday, cholecystitis is not excluded. Electronically Signed   By: Marnee SpringJonathon  Watts M.D.   On: 11/06/2018 13:33    Procedures Procedures (including critical care time)  Medications Ordered in ED Medications  HYDROmorphone (DILAUDID) injection 1 mg (1 mg Intravenous Given 11/08/18 1013)  ondansetron (ZOFRAN) injection 4 mg (4 mg Intravenous Given 11/08/18 1013)     Initial Impression / Assessment and Plan / ED Course  I have reviewed the triage vital signs and the nursing notes.  Pertinent labs & imaging results that were available during my care of the patient were reviewed by me and considered in my medical decision making (see chart for details).     Dr. Lovell SheehanJenkins office was advised of patient's arrival here.  11:50 AM Labs are resulted  and reviewed with patient and wife.  Also discussed case with Dr. Lovell SheehanJenkins who will plan to admit patient overnight for symptom relief with anticipation of a cholecystectomy in the morning.  Discussed this plan with patient and wife and they are agreeable.  Final Clinical Impressions(s) / ED Diagnoses   Final diagnoses:  Acute cholecystitis    ED Discharge Orders    None       Victoriano Laindol, Bayden Gil, PA-C 11/08/18 1150    Donnetta Hutchingook, Brian, MD 11/10/18 1520

## 2018-11-08 NOTE — ED Triage Notes (Signed)
Pt was seen 11/05/18 for same, returning with left sided pain that radiates to back, pt has appt with Dr. Lovell SheehanJenkins this am at 1030 but has had increased pain, pt denies n/v/d/fever/urinary symptoms; wife speaks [place], pt speaks [place]

## 2018-11-09 ENCOUNTER — Encounter (HOSPITAL_COMMUNITY)
Admission: EM | Disposition: A | Discharge status: Home / Self Care | Payer: Self-pay | Source: Home / Self Care | Attending: Emergency Medicine

## 2018-11-09 ENCOUNTER — Encounter (HOSPITAL_COMMUNITY): Payer: Self-pay | Admitting: Emergency Medicine

## 2018-11-09 ENCOUNTER — Observation Stay (HOSPITAL_COMMUNITY): Payer: Self-pay | Admitting: Anesthesiology

## 2018-11-09 ENCOUNTER — Other Ambulatory Visit: Payer: Self-pay

## 2018-11-09 DIAGNOSIS — K8000 Calculus of gallbladder with acute cholecystitis without obstruction: Secondary | ICD-10-CM

## 2018-11-09 HISTORY — PX: CHOLECYSTECTOMY: SHX55

## 2018-11-09 LAB — HIV ANTIBODY (ROUTINE TESTING W REFLEX): HIV Screen 4th Generation wRfx: NONREACTIVE

## 2018-11-09 SURGERY — LAPAROSCOPIC CHOLECYSTECTOMY
Anesthesia: General | Site: Abdomen

## 2018-11-09 MED ORDER — ACETAMINOPHEN 650 MG RE SUPP: 650.0000 mg | Freq: Four times a day (QID) | RECTAL | Status: DC | PRN

## 2018-11-09 MED ORDER — ONDANSETRON 4 MG PO TBDP
4.0000 mg | ORAL_TABLET | Freq: Four times a day (QID) | ORAL | Status: DC | PRN
  Administered 2018-11-09: 4 mg via ORAL
  Filled 2018-11-09: qty 1

## 2018-11-09 MED ORDER — 0.9 % SODIUM CHLORIDE (POUR BTL) OPTIME
TOPICAL | Status: DC | PRN
  Administered 2018-11-09: 1000 mL

## 2018-11-09 MED ORDER — SODIUM CHLORIDE 0.9 % IV SOLN
INTRAVENOUS | Status: DC
  Administered 2018-11-09 (×2): via INTRAVENOUS

## 2018-11-09 MED ORDER — ONDANSETRON HCL 4 MG/2ML IJ SOLN: 4.0000 mg | Freq: Once | INTRAMUSCULAR | Status: DC | PRN

## 2018-11-09 MED ORDER — SIMETHICONE 80 MG PO CHEW: 40.0000 mg | CHEWABLE_TABLET | Freq: Four times a day (QID) | ORAL | Status: DC | PRN

## 2018-11-09 MED ORDER — DEXAMETHASONE SODIUM PHOSPHATE 10 MG/ML IJ SOLN
INTRAMUSCULAR | Status: AC
  Filled 2018-11-09: qty 1

## 2018-11-09 MED ORDER — MIDAZOLAM HCL 2 MG/2ML IJ SOLN
INTRAMUSCULAR | Status: AC
  Filled 2018-11-09: qty 2

## 2018-11-09 MED ORDER — SUGAMMADEX SODIUM 200 MG/2ML IV SOLN
INTRAVENOUS | Status: DC | PRN
  Administered 2018-11-09: 147.2 mg via INTRAVENOUS

## 2018-11-09 MED ORDER — KETOROLAC TROMETHAMINE 30 MG/ML IJ SOLN
30.0000 mg | Freq: Four times a day (QID) | INTRAMUSCULAR | Status: AC
  Administered 2018-11-09: 30 mg via INTRAVENOUS
  Filled 2018-11-09: qty 1

## 2018-11-09 MED ORDER — ONDANSETRON HCL 4 MG/2ML IJ SOLN
INTRAMUSCULAR | Status: AC
  Filled 2018-11-09: qty 2

## 2018-11-09 MED ORDER — DIPHENHYDRAMINE HCL 25 MG PO CAPS: 25.0000 mg | ORAL_CAPSULE | Freq: Four times a day (QID) | ORAL | Status: DC | PRN

## 2018-11-09 MED ORDER — ONDANSETRON HCL 4 MG/2ML IJ SOLN
INTRAMUSCULAR | Status: DC | PRN
  Administered 2018-11-09: 4 mg via INTRAVENOUS

## 2018-11-09 MED ORDER — SUGAMMADEX SODIUM 200 MG/2ML IV SOLN
INTRAVENOUS | Status: AC
  Filled 2018-11-09: qty 2

## 2018-11-09 MED ORDER — MEPERIDINE HCL 50 MG/ML IJ SOLN: 6.2500 mg | INTRAMUSCULAR | Status: DC | PRN

## 2018-11-09 MED ORDER — DIPHENHYDRAMINE HCL 50 MG/ML IJ SOLN: 25.0000 mg | Freq: Four times a day (QID) | INTRAMUSCULAR | Status: DC | PRN

## 2018-11-09 MED ORDER — OXYCODONE-ACETAMINOPHEN 5-325 MG PO TABS
1.0000 | ORAL_TABLET | ORAL | Status: DC | PRN
  Administered 2018-11-09 – 2018-11-10 (×4): 2 via ORAL
  Filled 2018-11-09 (×4): qty 2

## 2018-11-09 MED ORDER — PROPOFOL 10 MG/ML IV BOLUS
INTRAVENOUS | Status: AC
  Filled 2018-11-09: qty 20

## 2018-11-09 MED ORDER — HYDROCODONE-ACETAMINOPHEN 7.5-325 MG PO TABS: 1.0000 | ORAL_TABLET | Freq: Once | ORAL | Status: DC | PRN

## 2018-11-09 MED ORDER — MIDAZOLAM HCL 5 MG/5ML IJ SOLN
INTRAMUSCULAR | Status: DC | PRN
  Administered 2018-11-09: 2 mg via INTRAVENOUS

## 2018-11-09 MED ORDER — POVIDONE-IODINE 10 % OINT PACKET
TOPICAL_OINTMENT | CUTANEOUS | Status: DC | PRN
  Administered 2018-11-09: 1 via TOPICAL

## 2018-11-09 MED ORDER — DEXAMETHASONE SODIUM PHOSPHATE 4 MG/ML IJ SOLN
INTRAMUSCULAR | Status: DC | PRN
  Administered 2018-11-09: 8 mg via INTRAVENOUS

## 2018-11-09 MED ORDER — GLYCOPYRROLATE PF 0.2 MG/ML IJ SOSY
PREFILLED_SYRINGE | INTRAMUSCULAR | Status: DC | PRN
  Administered 2018-11-09: .2 mg via INTRAVENOUS

## 2018-11-09 MED ORDER — ONDANSETRON HCL 4 MG/2ML IJ SOLN: 4.0000 mg | Freq: Four times a day (QID) | INTRAMUSCULAR | Status: DC | PRN

## 2018-11-09 MED ORDER — LIDOCAINE 2% (20 MG/ML) 5 ML SYRINGE
INTRAMUSCULAR | Status: DC | PRN
  Administered 2018-11-09: 40 mg via INTRAVENOUS

## 2018-11-09 MED ORDER — KETOROLAC TROMETHAMINE 30 MG/ML IJ SOLN
30.0000 mg | Freq: Once | INTRAMUSCULAR | Status: AC | PRN
  Administered 2018-11-09: 30 mg via INTRAVENOUS
  Filled 2018-11-09: qty 1

## 2018-11-09 MED ORDER — ENOXAPARIN SODIUM 40 MG/0.4ML ~~LOC~~ SOLN
40.0000 mg | SUBCUTANEOUS | Status: DC
  Administered 2018-11-10: 40 mg via SUBCUTANEOUS
  Filled 2018-11-09 (×2): qty 0.4

## 2018-11-09 MED ORDER — FENTANYL CITRATE (PF) 100 MCG/2ML IJ SOLN
INTRAMUSCULAR | Status: DC | PRN
  Administered 2018-11-09 (×2): 50 ug via INTRAVENOUS
  Administered 2018-11-09 (×2): 100 ug via INTRAVENOUS
  Administered 2018-11-09: 50 ug via INTRAVENOUS

## 2018-11-09 MED ORDER — ACETAMINOPHEN 325 MG PO TABS: 650.0000 mg | ORAL_TABLET | Freq: Four times a day (QID) | ORAL | Status: DC | PRN

## 2018-11-09 MED ORDER — SUCCINYLCHOLINE CHLORIDE 20 MG/ML IJ SOLN
INTRAMUSCULAR | Status: DC | PRN
  Administered 2018-11-09: 120 mg via INTRAVENOUS

## 2018-11-09 MED ORDER — LACTATED RINGERS IV SOLN: INTRAVENOUS | Status: DC

## 2018-11-09 MED ORDER — PROPOFOL 10 MG/ML IV BOLUS
INTRAVENOUS | Status: DC | PRN
  Administered 2018-11-09: 50 mg via INTRAVENOUS
  Administered 2018-11-09: 160 mg via INTRAVENOUS
  Administered 2018-11-09: 40 mg via INTRAVENOUS

## 2018-11-09 MED ORDER — KETOROLAC TROMETHAMINE 30 MG/ML IJ SOLN
30.0000 mg | Freq: Four times a day (QID) | INTRAMUSCULAR | Status: DC | PRN
  Filled 2018-11-09: qty 1

## 2018-11-09 MED ORDER — LACTATED RINGERS IV SOLN
INTRAVENOUS | Status: DC | PRN
  Administered 2018-11-09 (×2): via INTRAVENOUS

## 2018-11-09 MED ORDER — HYDROMORPHONE HCL 1 MG/ML IJ SOLN
1.0000 mg | INTRAMUSCULAR | Status: DC | PRN
  Administered 2018-11-10: 1 mg via INTRAVENOUS
  Filled 2018-11-09: qty 1

## 2018-11-09 MED ORDER — BUPIVACAINE LIPOSOME 1.3 % IJ SUSP
INTRAMUSCULAR | Status: DC | PRN
  Administered 2018-11-09: 20 mL

## 2018-11-09 MED ORDER — HEMOSTATIC AGENTS (NO CHARGE) OPTIME
TOPICAL | Status: DC | PRN
  Administered 2018-11-09 (×2): 1 via TOPICAL

## 2018-11-09 MED ORDER — FENTANYL CITRATE (PF) 100 MCG/2ML IJ SOLN
INTRAMUSCULAR | Status: AC
  Filled 2018-11-09: qty 2

## 2018-11-09 MED ORDER — HYDROMORPHONE HCL 1 MG/ML IJ SOLN
0.2500 mg | INTRAMUSCULAR | Status: DC | PRN
  Administered 2018-11-09 (×2): 0.5 mg via INTRAVENOUS
  Filled 2018-11-09 (×2): qty 0.5

## 2018-11-09 MED ORDER — FENTANYL CITRATE (PF) 250 MCG/5ML IJ SOLN
INTRAMUSCULAR | Status: AC
  Filled 2018-11-09: qty 5

## 2018-11-09 MED ORDER — ROCURONIUM BROMIDE 50 MG/5ML IV SOSY
PREFILLED_SYRINGE | INTRAVENOUS | Status: DC | PRN
  Administered 2018-11-09: 30 mg via INTRAVENOUS

## 2018-11-09 SURGICAL SUPPLY — 50 items
APPLICATOR ARISTA FLEXITIP XL (MISCELLANEOUS) ×3 IMPLANT
APPLIER CLIP ROT 10 11.4 M/L (STAPLE) ×3
BAG RETRIEVAL 10 (BASKET) ×1
BAG RETRIEVAL 10MM (BASKET) ×1
CHLORAPREP W/TINT 26ML (MISCELLANEOUS) ×3 IMPLANT
CLIP APPLIE ROT 10 11.4 M/L (STAPLE) ×1 IMPLANT
CLOTH BEACON ORANGE TIMEOUT ST (SAFETY) ×3 IMPLANT
COVER LIGHT HANDLE STERIS (MISCELLANEOUS) ×6 IMPLANT
COVER WAND RF STERILE (DRAPES) ×3 IMPLANT
ELECT REM PT RETURN 9FT ADLT (ELECTROSURGICAL) ×3
ELECTRODE REM PT RTRN 9FT ADLT (ELECTROSURGICAL) ×1 IMPLANT
FILTER SMOKE EVAC LAPAROSHD (FILTER) ×3 IMPLANT
GAUZE SPONGE 4X4 12PLY STRL (GAUZE/BANDAGES/DRESSINGS) ×3 IMPLANT
GLOVE BIO SURGEON STRL SZ7 (GLOVE) ×6 IMPLANT
GLOVE BIOGEL PI IND STRL 7.0 (GLOVE) ×3 IMPLANT
GLOVE BIOGEL PI INDICATOR 7.0 (GLOVE) ×6
GLOVE SURG SS PI 7.5 STRL IVOR (GLOVE) ×3 IMPLANT
GOWN STRL REUS W/ TWL XL LVL3 (GOWN DISPOSABLE) ×1 IMPLANT
GOWN STRL REUS W/TWL LRG LVL3 (GOWN DISPOSABLE) ×6 IMPLANT
GOWN STRL REUS W/TWL XL LVL3 (GOWN DISPOSABLE) ×2
HEMOSTAT ARISTA ABSORB 3G PWDR (MISCELLANEOUS) ×3 IMPLANT
HEMOSTAT SNOW SURGICEL 2X4 (HEMOSTASIS) ×3 IMPLANT
INST SET LAPROSCOPIC AP (KITS) ×3 IMPLANT
IV NS IRRIG 3000ML ARTHROMATIC (IV SOLUTION) IMPLANT
KIT TURNOVER KIT A (KITS) ×3 IMPLANT
MANIFOLD NEPTUNE II (INSTRUMENTS) ×3 IMPLANT
NEEDLE HYPO 18GX1.5 BLUNT FILL (NEEDLE) ×3 IMPLANT
NEEDLE HYPO 22GX1.5 SAFETY (NEEDLE) ×3 IMPLANT
NEEDLE INSUFFLATION 14GA 120MM (NEEDLE) ×3 IMPLANT
NS IRRIG 1000ML POUR BTL (IV SOLUTION) ×3 IMPLANT
PACK LAP CHOLE LZT030E (CUSTOM PROCEDURE TRAY) ×3 IMPLANT
PAD ARMBOARD 7.5X6 YLW CONV (MISCELLANEOUS) ×3 IMPLANT
SET BASIN LINEN APH (SET/KITS/TRAYS/PACK) ×3 IMPLANT
SET TUBE IRRIG SUCTION NO TIP (IRRIGATION / IRRIGATOR) IMPLANT
SLEEVE ENDOPATH XCEL 5M (ENDOMECHANICALS) ×3 IMPLANT
SPONGE GAUZE 2X2 8PLY STER LF (GAUZE/BANDAGES/DRESSINGS) ×4
SPONGE GAUZE 2X2 8PLY STRL LF (GAUZE/BANDAGES/DRESSINGS) ×8 IMPLANT
STAPLER VISISTAT (STAPLE) ×3 IMPLANT
SUT VICRYL 0 UR6 27IN ABS (SUTURE) ×6 IMPLANT
SYR 20CC LL (SYRINGE) ×3 IMPLANT
SYS BAG RETRIEVAL 10MM (BASKET) ×1
SYSTEM BAG RETRIEVAL 10MM (BASKET) ×1 IMPLANT
TAPE PAPER 3X10 WHT MICROPORE (GAUZE/BANDAGES/DRESSINGS) ×3 IMPLANT
TROCAR ENDO BLADELESS 11MM (ENDOMECHANICALS) ×3 IMPLANT
TROCAR XCEL NON-BLD 5MMX100MML (ENDOMECHANICALS) ×3 IMPLANT
TROCAR XCEL UNIV SLVE 11M 100M (ENDOMECHANICALS) ×3 IMPLANT
TUBE CONNECTING 12'X1/4 (SUCTIONS) ×1
TUBE CONNECTING 12X1/4 (SUCTIONS) ×2 IMPLANT
TUBING INSUFFLATION (TUBING) ×3 IMPLANT
WARMER LAPAROSCOPE (MISCELLANEOUS) ×3 IMPLANT

## 2018-11-09 NOTE — Anesthesia Procedure Notes (Signed)
Procedure Name: Intubation Date/Time: 11/09/2018 9:05 AM Performed by: Pernell DupreAdams, Amy A, CRNA Pre-anesthesia Checklist: Patient identified, Patient being monitored, Timeout performed, Emergency Drugs available and Suction available Patient Re-evaluated:Patient Re-evaluated prior to induction Oxygen Delivery Method: Circle system utilized Preoxygenation: Pre-oxygenation with 100% oxygen Induction Type: IV induction Ventilation: Mask ventilation without difficulty Laryngoscope Size: 3 and Miller Grade View: Grade II Tube type: Oral Tube size: 7.0 mm Number of attempts: 1 Airway Equipment and Method: Stylet Placement Confirmation: ETT inserted through vocal cords under direct vision,  positive ETCO2 and breath sounds checked- equal and bilateral Secured at: 21 cm Tube secured with: Tape Dental Injury: Teeth and Oropharynx as per pre-operative assessment

## 2018-11-09 NOTE — Anesthesia Postprocedure Evaluation (Signed)
Anesthesia Post Note Late Entry for 1045  Patient: Arthur Walker  Procedure(s) Performed: LAPAROSCOPIC CHOLECYSTECTOMY (N/A Abdomen)  Patient location during evaluation: PACU Anesthesia Type: General and MAC Level of consciousness: awake and alert and oriented Pain management: pain level controlled Vital Signs Assessment: post-procedure vital signs reviewed and stable Respiratory status: spontaneous breathing Cardiovascular status: stable Postop Assessment: no apparent nausea or vomiting Anesthetic complications: no     Last Vitals:  Vitals:   11/09/18 1030 11/09/18 1052  BP: (!) 135/95 (!) 137/96  Pulse: 74 66  Resp: (!) 8 18  Temp:  36.8 C  SpO2: 100% 99%    Last Pain:  Vitals:   11/09/18 1052  TempSrc: Oral  PainSc:                  ADAMS, AMY A

## 2018-11-09 NOTE — Interval H&P Note (Signed)
History and Physical Interval Note:  11/09/2018 8:17 AM  [name]  has presented today for surgery, with the diagnosis of acute cholecystitis, cholelithiasis  The various methods of treatment have been discussed with the patient and family. After consideration of risks, benefits and other options for treatment, the patient has consented to  Procedure(s): LAPAROSCOPIC CHOLECYSTECTOMY (N/A) as a surgical intervention .  The patient's history has been reviewed, patient examined, no change in status, stable for surgery.  I have reviewed the patient's chart and labs.  Questions were answered to the patient's satisfaction.     Franky MachoMark Joycie Aerts

## 2018-11-09 NOTE — Op Note (Signed)
Patient:  Arthur BennettJose Aguilar Walker  DOB:  October 18, 1981  MRN:  [id number]   Preop Diagnosis: Acute cholecystitis, cholelithiasis  Postop Diagnosis: Same  Procedure: Laparoscopic cholecystectomy  Surgeon: Franky MachoMark Harmoney Sienkiewicz, MD  Anes: General endotracheal  Indications: Patient is a 37 year old Hispanic male who presents with acute cholecystitis secondary to cholelithiasis.  The risks and benefits of the procedure including bleeding, infection, hepatobiliary injury, and the possibility of an open procedure were fully explained to the patient, who gave informed consent.  Procedure note: The patient was placed in the supine position.  After induction of general endotracheal anesthesia, the abdomen was prepped and draped using the usual sterile technique with DuraPrep.  Surgical site confirmation was performed.  A supraumbilical incision was made down to the fascia.  A Veress needle was introduced into the abdominal cavity and confirmation of placement was done using the saline drop test.  The abdomen was then insufflated to 16 mmHg pressure.  An 11 mm trocar was introduced into the abdominal cavity under direct visualization without difficulty.  The patient was placed in reverse Trendelenburg position and an additional 11 mm trocar was placed in the epigastric region and 5 mm trochars were placed in the right upper quadrant and right flank regions.  Liver was inspected noted to be within normal limits.  The gallbladder was noted to be edematous and distended with stones.  The gallbladder was retracted in a dynamic fashion in order to provide a critical view of the triangle of Calot.  The cystic duct was first identified.  Its juncture to the infundibulum was fully identified.  Endoclips were placed proximally and distally on the cystic duct, and the cystic duct was divided.  This was likewise done to the cystic artery.  The gallbladder was freed away from the gallbladder fossa using Bovie electrocautery.   Gallbladder was delivered through the extended epigastric trocar site using an Endo Catch bag.  The gallbladder fossa was inspected and no abnormal bleeding or bile leakage was noted.  Arista and Surgicel were placed in the gallbladder fossa.  All fluid and air were then evacuated from the abdominal cavity prior to the removal of the trochars.  All wounds were irrigated with normal saline.  All wounds were injected with Exparel.  The supraumbilical as well as epigastric fascia were reapproximated using 0 Vicryl interrupted sutures.  All skin incisions were closed using staples.  Betadine ointment and dry sterile dressings were applied.  All tape and needle counts were correct at the end of the procedure.  The patient was extubated in the operating room and transferred to PACU in stable condition.  Complications: None  EBL: Minimal  Specimen: Gallbladder

## 2018-11-09 NOTE — Transfer of Care (Signed)
Immediate Anesthesia Transfer of Care Note  Patient: Arthur Walker  Procedure(s) Performed: LAPAROSCOPIC CHOLECYSTECTOMY (N/A Abdomen)  Patient Location: PACU  Anesthesia Type:General  Level of Consciousness: awake, alert , oriented and patient cooperative  Airway & Oxygen Therapy: Patient Spontanous Breathing  Post-op Assessment: Report given to RN and Post -op Vital signs reviewed and stable  Post vital signs: Reviewed and stable  Last Vitals:  Vitals Value Taken Time  BP 137/98 11/09/2018 10:10 AM  Temp    Pulse 65 11/09/2018 10:12 AM  Resp 14 11/09/2018 10:12 AM  SpO2 100 % 11/09/2018 10:12 AM  Vitals shown include unvalidated device data.  Last Pain:  Vitals:   11/09/18 0831  TempSrc: Oral  PainSc: 0-No pain         Complications: No apparent anesthesia complications

## 2018-11-09 NOTE — Anesthesia Preprocedure Evaluation (Signed)
Anesthesia Evaluation  Patient identified by MRN, date of birth, ID band Patient awake    Reviewed: Allergy & Precautions, H&P , NPO status , Patient's Chart, lab work & pertinent test results  Airway Mallampati: II  TM Distance: >3 FB Neck ROM: full    Dental no notable dental hx.    Pulmonary neg pulmonary ROS, former smoker,    Pulmonary exam normal breath sounds clear to auscultation       Cardiovascular Exercise Tolerance: Good negative cardio ROS   Rhythm:regular Rate:Normal     Neuro/Psych negative neurological ROS  negative psych ROS   GI/Hepatic negative GI ROS, Neg liver ROS, GERD  ,  Endo/Other  negative endocrine ROS  Renal/GU negative Renal ROS  negative genitourinary   Musculoskeletal   Abdominal   Peds  Hematology negative hematology ROS (+)   Anesthesia Other Findings Spanish speaking.  Used Interpreter   Reproductive/Obstetrics negative OB ROS                             Anesthesia Physical Anesthesia Plan  ASA: II  Anesthesia Plan: General   Post-op Pain Management:    Induction:   PONV Risk Score and Plan:   Airway Management Planned:   Additional Equipment:   Intra-op Plan:   Post-operative Plan:   Informed Consent: I have reviewed the patients History and Physical, chart, labs and discussed the procedure including the risks, benefits and alternatives for the proposed anesthesia with the patient or authorized representative who has indicated his/her understanding and acceptance.   Dental Advisory Given  Plan Discussed with: CRNA  Anesthesia Plan Comments:         Anesthesia Quick Evaluation

## 2018-11-09 NOTE — Anesthesia Postprocedure Evaluation (Signed)
Anesthesia Post Note Late entry for 1045  Patient: Arthur Walker  Procedure(s) Performed: LAPAROSCOPIC CHOLECYSTECTOMY (N/A Abdomen)  Anesthesia Type: General     Last Vitals:  Vitals:   11/09/18 1030 11/09/18 1052  BP: (!) 135/95 (!) 137/96  Pulse: 74 66  Resp: (!) 8 18  Temp:  36.8 C  SpO2: 100% 99%    Last Pain:  Vitals:   11/09/18 1052  TempSrc: Oral  PainSc:                  ADAMS, AMY A

## 2018-11-10 LAB — CBC
HCT: 41.9 % (ref 39.0–52.0)
HEMOGLOBIN: 14.3 g/dL (ref 13.0–17.0)
MCH: 33 pg (ref 26.0–34.0)
MCHC: 34.1 g/dL (ref 30.0–36.0)
MCV: 96.8 fL (ref 80.0–100.0)
Platelets: 200 10*3/uL (ref 150–400)
RBC: 4.33 MIL/uL (ref 4.22–5.81)
RDW: 12.9 % (ref 11.5–15.5)
WBC: 9.2 10*3/uL (ref 4.0–10.5)
nRBC: 0 % (ref 0.0–0.2)

## 2018-11-10 LAB — COMPREHENSIVE METABOLIC PANEL
ALK PHOS: 70 U/L (ref 38–126)
ALT: 89 U/L — ABNORMAL HIGH (ref 0–44)
AST: 56 U/L — ABNORMAL HIGH (ref 15–41)
Albumin: 3.2 g/dL — ABNORMAL LOW (ref 3.5–5.0)
Anion gap: 5 (ref 5–15)
BUN: 10 mg/dL (ref 6–20)
CALCIUM: 8.5 mg/dL — AB (ref 8.9–10.3)
CO2: 26 mmol/L (ref 22–32)
Chloride: 105 mmol/L (ref 98–111)
Creatinine, Ser: 0.85 mg/dL (ref 0.61–1.24)
GFR calc Af Amer: >60 mL/min (ref >60)
GFR calc non Af Amer: >60 mL/min (ref >60)
Glucose, Bld: 107 mg/dL — ABNORMAL HIGH (ref 70–99)
Potassium: 3.7 mmol/L (ref 3.5–5.1)
Sodium: 136 mmol/L (ref 135–145)
TOTAL PROTEIN: 7 g/dL (ref 6.5–8.1)
Total Bilirubin: 0.8 mg/dL (ref 0.3–1.2)

## 2018-11-10 MED ORDER — HYDROCODONE-ACETAMINOPHEN 5-325 MG PO TABS: 1.0000 | ORAL_TABLET | ORAL | 0 refills | Status: DC | PRN

## 2018-11-10 MED ORDER — HYDROCODONE-ACETAMINOPHEN 5-325 MG PO TABS: 1.0000 | ORAL_TABLET | ORAL | 0 refills | Status: AC | PRN

## 2018-11-10 NOTE — Progress Notes (Signed)
Pain assessment was performed using Spanish translator services. Marco# [id number].

## 2018-11-10 NOTE — Discharge Instructions (Signed)
Colecistectoma laparoscpica, cuidados posteriores  Laparoscopic Cholecystectomy, Care After  Lea esta informacin sobre cmo cuidarse despus del procedimiento. Su mdico tambin podr darle indicaciones ms especficas. Comunquese con su mdico si tiene problemas o preguntas.  Qu puedo esperar despus del procedimiento?  Despus del procedimiento, es comn tener los siguientes sntomas:   Dolor en los lugares de la incisin. Le darn analgsicos para controlar el dolor.   Nuseas o vmitos leves.   Distensin abdominal y posiblemente dolor en el hombro debido al gas que se us durante el procedimiento.  Siga estas indicaciones en su casa:  Cuidados de la incisin     Siga las indicaciones del mdico acerca del cuidado de las incisiones. Haga lo siguiente:  ? Lvese las manos con agua y jabn antes de cambiar la venda (vendaje). Use desinfectante para manos si no dispone de agua y jabn.  ? Cambie el vendaje como se lo haya indicado el mdico.  ? No retire los puntos (suturas), la goma para cerrar la piel o las tiras adhesivas. Es posible que estos cierres cutneos deban quedar puestos en la piel durante 2semanas o ms tiempo. Si los bordes de las tiras adhesivas empiezan a despegarse y enroscarse, puede recortar los que estn sueltos. No retire las tiras adhesivas por completo a menos que el mdico se lo indique.   No tome baos de inmersin, no nade ni use el jacuzzi hasta que el mdico lo autorice. Pregntele al mdico si puede ducharse. Tal vez solo le permitan tomar baos de esponja.   Controle todos los das la zona de la incisin para detectar signos de infeccin. Est atento a los siguientes signos:  ? Aumento del enrojecimiento, la hinchazn o el dolor.  ? Mayor presencia de lquido o sangre.  ? Calor.  ? Pus o mal olor.  Actividad   No conduzca ni use maquinaria pesada mientras toma analgsicos recetados.   No levante ningn objeto que pese ms de 10libras (4,5kg) hasta que el mdico lo  autorice.   No practique deportes de contacto hasta que el mdico lo autorice.   No conduzca durante 24horas si le dieron un medicamento para ayudarlo a que se relaje (sedante).   Descanse todo lo que sea necesario. No regrese al trabajo o a la escuela hasta que el mdico lo autorice.  Instrucciones generales   Tome los medicamentos de venta libre y los recetados solamente como se lo haya indicado el mdico.   A fin de prevenir o tratar el estreimiento mientras toma analgsicos recetados, el mdico puede recomendarle lo siguiente:  ? Beba suficiente lquido para mantener la orina clara o de color amarillo plido.  ? Tome medicamentos recetados o de venta libre.  ? Consuma alimentos ricos en fibra, como frutas y verduras frescas, cereales integrales y frijoles.  ? Limite el consumo de alimentos con alto contenido de grasas y azcares procesados, como alimentos fritos o dulces.  Comunquese con un mdico si:   Presenta una erupcin cutnea.   Aumentan el enrojecimiento, la hinchazn o el dolor alrededor de la incisin.   Le sale ms lquido o sangre de las incisiones.   Las incisiones estn calientes al tacto.   Tiene pus o percibe que sale mal olor del lugar de las incisiones.   Tiene fiebre.   Una o ms de las incisiones se abren.  Solicite ayuda de inmediato si:   Tiene dificultad para respirar.   Siente dolor en el pecho.   Siente ms dolor en los   hombros.   Se desmaya o se siente mareado al ponerse de pie.   Siente un dolor intenso en el abdomen.   Tiene nuseas o vmitos durante ms de un da.   Siente dolor en la pierna.  Esta informacin no tiene como fin reemplazar el consejo del mdico. Asegrese de hacerle al mdico cualquier pregunta que tenga.  Document Released: 06/20/2011 Document Revised: 03/16/2017 Document Reviewed: 04/25/2016  Elsevier Interactive Patient Education  2019 Elsevier Inc.

## 2018-11-10 NOTE — Progress Notes (Signed)
Patient's IV catheter removed and intact. Pt's IV site clean dry and intact. Discharge instructions including medications and follow up appointments were reviewed and discussed with patient and his wife. All questions were answered and no further questions at this time. Pt in stable condition and in no acute distress at time of discharge. Pt escorted by nurse tech.   All instructions to patient has been used utilizing [employer] tele translator services. De NurseMarco #[id number] discussed all medications, follow up appointments. Also, signs and symptoms of infection and instructions regarding surgery site. Handout in Spanish was also given to patient.

## 2018-11-10 NOTE — Discharge Summary (Signed)
Physician Discharge Summary  Patient ID: Arthur BennettJose Aguilar Guidotti MRN: [id number] DOB/AGE: [birth year] 37 y.o.  Admit date: 11/08/2018 Discharge date: 11/10/2018  Admission Diagnoses: Acute cholecystitis, cholelithiasis  Discharge Diagnoses: Same Active Problems:   Acute cholecystitis due to biliary calculus   Discharged Condition: good  Hospital Course: Patient is a 37 year old Hispanic male who presented to the emergency room with recurrent episode of right upper quadrant abdominal pain.  He had a known history of cholelithiasis.  He was brought into the hospital for control of his pain and nausea.  He subsequently underwent a laparoscopic cholecystectomy on 11/09/2018.  He tolerated the procedure well.  His postoperative course has been unremarkable.  His diet was advanced difficulty.  The patient is being discharged home on 11/10/2018 in good and improving condition.  Treatments: surgery: Laparoscopic cholecystectomy on 11/09/2018  Discharge Exam: Blood pressure 111/78, pulse 83, temperature 98.4 F (36.9 C), temperature source Oral, resp. rate 20, height 5\' 6"  (1.676 m), weight 73.6 kg, SpO2 98 %. General appearance: alert, cooperative and no distress Resp: clear to auscultation bilaterally Cardio: regular rate and rhythm, S1, S2 normal, no murmur, click, rub or gallop GI: Soft, dressing is dry and intact.  Disposition: Discharge disposition: 01-Home or Self Care       Discharge Instructions    Diet general   Complete by:  As directed    Increase activity slowly   Complete by:  As directed      Allergies as of 11/10/2018   No Known Allergies     Medication List    STOP taking these medications   amoxicillin-clavulanate 875-125 MG tablet Commonly known as:  AUGMENTIN     TAKE these medications   HYDROcodone-acetaminophen 5-325 MG tablet Commonly known as:  NORCO/VICODIN Take 1 tablet by mouth every 4 (four) hours as needed. What changed:  how much to take    omeprazole 20 MG capsule Commonly known as:  PRILOSEC Take 1 capsule (20 mg total) by mouth daily.   ondansetron 4 MG tablet Commonly known as:  ZOFRAN Take 1 tablet (4 mg total) by mouth every 6 (six) hours.      Follow-up Information    Franky MachoJenkins, Kelliann Pendergraph, MD. Schedule an appointment as soon as possible for a visit on 11/22/2018.   Specialty:  General Surgery Contact information: [address] [place] [id number] [phone]           Signed: Franky MachoMark Julina Altmann 11/10/2018, 10:01 AM

## 2018-11-12 ENCOUNTER — Encounter (HOSPITAL_COMMUNITY): Payer: Self-pay | Admitting: General Surgery

## 2018-11-27 ENCOUNTER — Encounter: Payer: Self-pay | Admitting: General Surgery

## 2018-11-27 ENCOUNTER — Ambulatory Visit (INDEPENDENT_AMBULATORY_CARE_PROVIDER_SITE_OTHER): Payer: Self-pay | Admitting: General Surgery

## 2018-11-27 VITALS — BP 125/82 | HR 65 | Temp 97.5°F | Resp 16 | Wt 163.4 lb

## 2018-11-27 DIAGNOSIS — Z09 Encounter for follow-up examination after completed treatment for conditions other than malignant neoplasm: Secondary | ICD-10-CM

## 2018-11-28 NOTE — Progress Notes (Signed)
Subjective:     Arthur Walker  Here for follow-up status post laparoscopic cholecystectomy.  Patient doing well.  Denies any significant abdominal pain. Objective:    BP 125/82 (BP Location: Left Arm, Patient Position: Sitting, Cuff Size: Normal)   Pulse 65   Temp (!) 97.5 F (36.4 C) (Temporal)   Resp 16   Wt 163 lb 6.4 oz (74.1 kg)   BMI 26.37 kg/m   General:  alert, cooperative and no distress  Abdomen soft, incisions healing well.  Staples removed, Steri-Strips applied. Final pathology consistent with diagnosis.     Assessment:    Doing well postoperatively.    Plan:   May return to work without restrictions on 12/10/2018.  Follow-up here as needed.

## 2019-07-10 ENCOUNTER — Emergency Department (HOSPITAL_COMMUNITY)
Admission: EM | Admit: 2019-07-10 | Discharge: 2019-07-10 | Disposition: A | Discharge status: Home / Self Care | Payer: Self-pay | Attending: Emergency Medicine | Admitting: Emergency Medicine

## 2019-07-10 ENCOUNTER — Other Ambulatory Visit: Payer: Self-pay

## 2019-07-10 ENCOUNTER — Encounter (HOSPITAL_COMMUNITY): Payer: Self-pay | Admitting: Emergency Medicine

## 2019-07-10 DIAGNOSIS — L739 Follicular disorder, unspecified: Secondary | ICD-10-CM

## 2019-07-10 DIAGNOSIS — Z87891 Personal history of nicotine dependence: Secondary | ICD-10-CM | POA: Insufficient documentation

## 2019-07-10 DIAGNOSIS — Z79899 Other long term (current) drug therapy: Secondary | ICD-10-CM | POA: Insufficient documentation

## 2019-07-10 MED ORDER — SULFAMETHOXAZOLE-TRIMETHOPRIM 800-160 MG PO TABS: 1.0000 | ORAL_TABLET | Freq: Two times a day (BID) | ORAL | 0 refills | Status: AC

## 2019-07-10 MED ORDER — MUPIROCIN CALCIUM 2 % EX CREA: TOPICAL_CREAM | CUTANEOUS | 0 refills | Status: AC

## 2019-07-10 NOTE — ED Triage Notes (Signed)
Rash to both sides of face since Monday.

## 2019-07-10 NOTE — Discharge Instructions (Addendum)
Do not shave for the next 10 days.  See your Physician for recheck

## 2019-07-10 NOTE — ED Provider Notes (Signed)
[facility] EMERGENCY DEPARTMENT Provider Note   CSN: [id number] Arrival date & time: 07/10/19  1750     History   Chief Complaint Chief Complaint  Patient presents with  . Rash    HPI Arthur Walker is a 38 y.o. male.     The history is provided by the patient. No language interpreter was used.  Rash Location:  Face Facial rash location:  Face Quality: redness and swelling   Severity:  Moderate Timing:  Constant Progression:  Worsening Chronicity:  Recurrent Relieved by:  Nothing Worsened by:  Nothing Ineffective treatments:  None tried Pt has taken a week of doxycycline and used triamcinalone cream without relief.   Past Medical History:  Diagnosis Date  . Acid reflux     Patient Active Problem List   Diagnosis Date Noted  . Acute cholecystitis due to biliary calculus 11/08/2018  . Hyperlipidemia 05/29/2017  . Gastroesophageal reflux disease 01/30/2017  . Lung nodule seen on imaging study 01/30/2017  . Cigarette nicotine dependence without complication 01/02/2017    Past Surgical History:  Procedure Laterality Date  . CHOLECYSTECTOMY N/A 11/09/2018   Procedure: LAPAROSCOPIC CHOLECYSTECTOMY;  Surgeon: Franky MachoJenkins, Mark, MD;  Location: [facility];  Service: General;  Laterality: N/A;        Home Medications    Prior to Admission medications   Medication Sig Start Date End Date Taking? Authorizing Provider  HYDROcodone-acetaminophen (NORCO/VICODIN) 5-325 MG tablet Take 1 tablet by mouth every 4 (four) hours as needed. 11/10/18   Franky MachoJenkins, Mark, MD  mupirocin cream Idelle Jo(BACTROBAN) 2 % Apply to affected area 3 times daily 07/10/19 07/09/20  Elson AreasSofia, Lekita Kerekes K, PA-C  omeprazole (PRILOSEC) 20 MG capsule Take 1 capsule (20 mg total) by mouth daily. 09/03/18   Benjiman CorePickering, Nathan, MD  ondansetron (ZOFRAN) 4 MG tablet Take 1 tablet (4 mg total) by mouth every 6 (six) hours. 11/05/18   Ivery QualeBryant, Hobson, PA-C  sulfamethoxazole-trimethoprim (BACTRIM DS) 800-160 MG tablet Take 1  tablet by mouth 2 (two) times daily for 10 days. 07/10/19 07/20/19  Elson AreasSofia, Morganna Styles K, PA-C    Family History Family History  Problem Relation Age of Onset  . Diabetes Mother   . Hypertension Mother   . Diabetes Father   . Diabetes Maternal Uncle   . Hypertension Maternal Uncle     Social History Social History   Tobacco Use  . Smoking status: Former Smoker    Packs/day: 0.25    Years: 17.00    Pack years: 4.25    Types: Cigarettes    Quit date: 01/30/2017    Years since quitting: 2.4  . Smokeless tobacco: Never Used  Substance Use Topics  . Alcohol use: Yes    Alcohol/week: 2.0 standard drinks    Types: 2 Cans of beer per week    Comment: 2  32oz beer daily  . Drug use: No     Allergies   Patient has no known allergies.   Review of Systems Review of Systems  Skin: Positive for rash.  All other systems reviewed and are negative.    Physical Exam Updated Vital Signs BP (!) 147/96 (BP Location: Right Arm)   Pulse 71   Temp 98.1 F (36.7 C) (Oral)   Resp 16   SpO2 99%   Physical Exam Vitals signs and nursing note reviewed.  Constitutional:      Appearance: He is well-developed.  HENT:     Head: Normocephalic and atraumatic.  Eyes:     Conjunctiva/sclera: Conjunctivae normal.  Neck:     Musculoskeletal: Neck supple.  Cardiovascular:     Rate and Rhythm: Normal rate and regular rhythm.     Heart sounds: No murmur.  Pulmonary:     Effort: Pulmonary effort is normal. No respiratory distress.     Breath sounds: Normal breath sounds.  Abdominal:     Palpations: Abdomen is soft.     Tenderness: There is no abdominal tenderness.  Skin:    Findings: Rash present.     Comments: Red raised areas  Pimples with white heads,  Full face,   Neurological:     General: No focal deficit present.     Mental Status: He is alert.  Psychiatric:        Mood and Affect: Mood normal.      ED Treatments / Results  Labs (all labs ordered are listed, but only  abnormal results are displayed) Labs Reviewed - No data to display  EKG None  Radiology No results found.  Procedures Procedures (including critical care time)  Medications Ordered in ED Medications - No data to display   Initial Impression / Assessment and Plan / ED Course  I have reviewed the triage vital signs and the nursing notes.  Pertinent labs & imaging results that were available during my care of the patient were reviewed by me and considered in my medical decision making (see chart for details).        MDM  Rash looks like a folliculitis.  I advised pt to avoid shaving.  I will treat with bactrim and bactroban.    Final Clinical Impressions(s) / ED Diagnoses   Final diagnoses:  Folliculitis    ED Discharge Orders         Ordered    sulfamethoxazole-trimethoprim (BACTRIM DS) 800-160 MG tablet  2 times daily     07/10/19 1826    mupirocin cream (BACTROBAN) 2 %     07/10/19 1826        An After Visit Summary was printed and given to the patient.    Fransico Meadow, [place] 07/10/19 1916    Margette Fast, MD 07/11/19 1319

## 2020-03-03 IMAGING — CT CT ABD-PELV W/ CM
2 of 4 series · 15 of 46 positions shown, 17 images · IV contrast (Isovue)
Comparison: CT 04/16/2011

CLINICAL DATA: RIGHT upper quadrant pain the radiates LEFT side.

EXAM:
CT ABDOMEN AND PELVIS WITH CONTRAST
TECHNIQUE: Multidetector CT imaging of the abdomen and pelvis was performed
using the standard protocol following bolus administration of
intravenous contrast.
CONTRAST:  100mL GBZZJE-IOO IOPAMIDOL (GBZZJE-IOO) INJECTION 61%

[Series 2: axial st · axial · 0.72mm/px · z∈[-238,+207]mm · 12 of 107 slices shown, 14 images]
[im 9/107  soft-tissue]
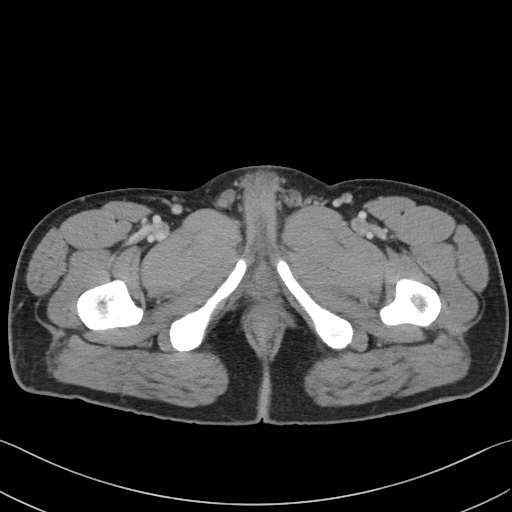
[im 9/107  bone]
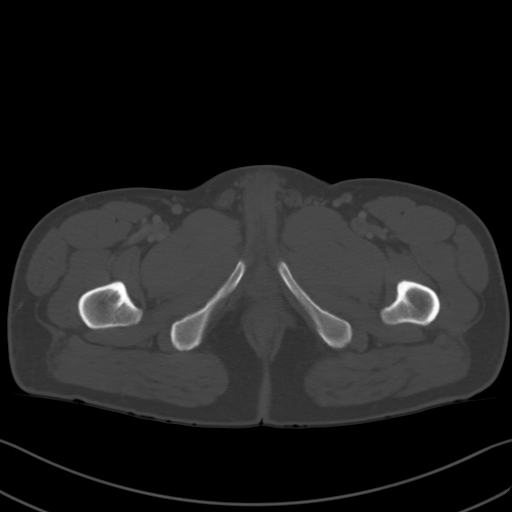
[im 17/107  soft-tissue]
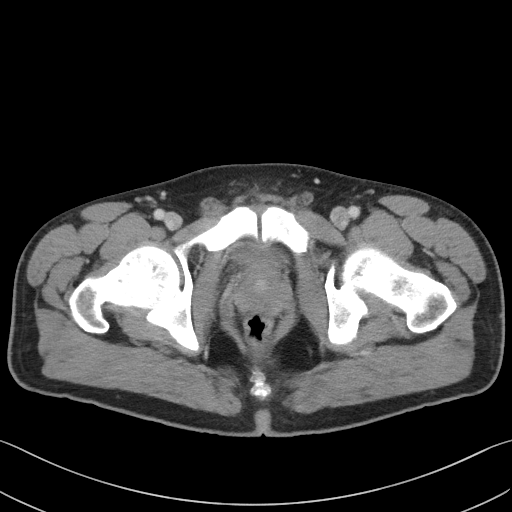
[im 25/107  soft-tissue]
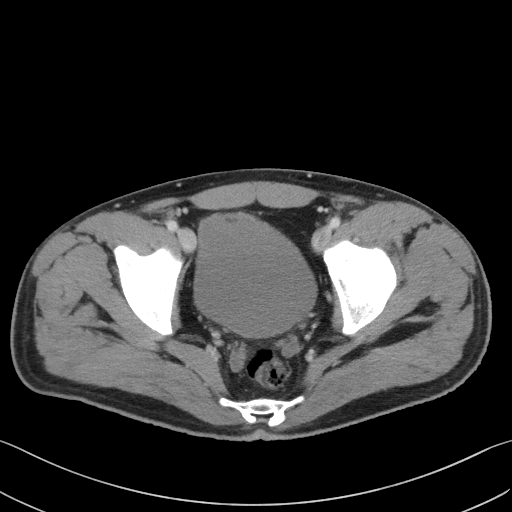
[im 33/107  soft-tissue]
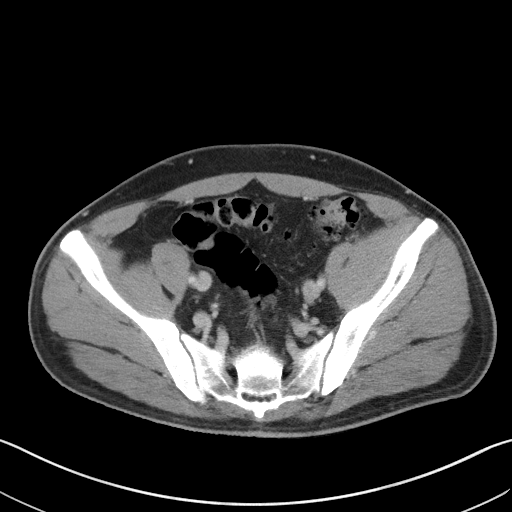
[im 41/107  soft-tissue]
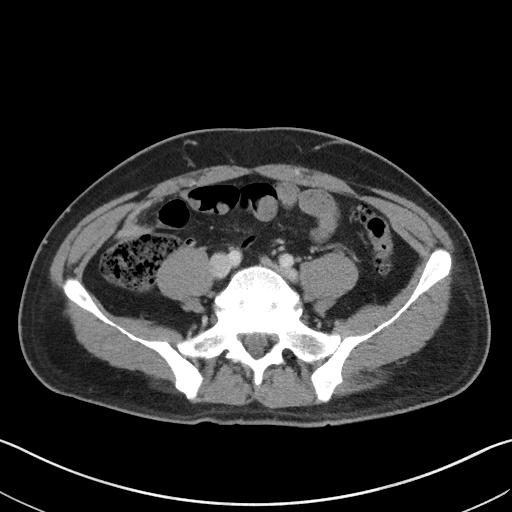
[im 49/107  soft-tissue]
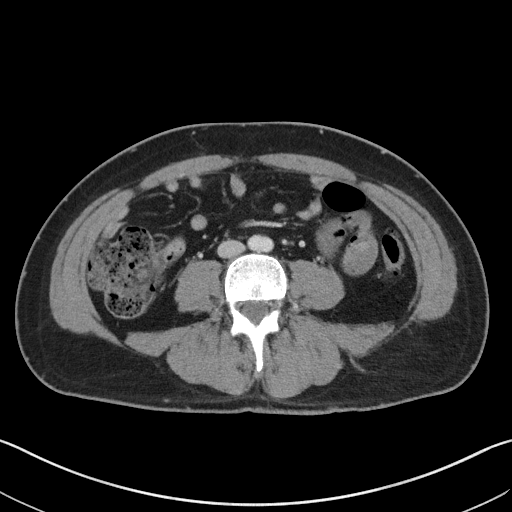
[im 58/107  soft-tissue]
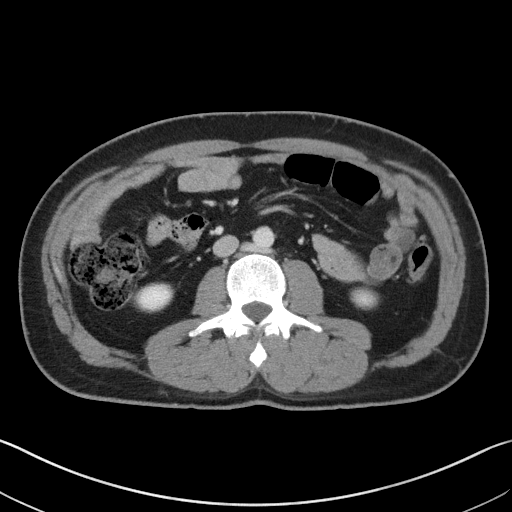
[im 66/107  soft-tissue]
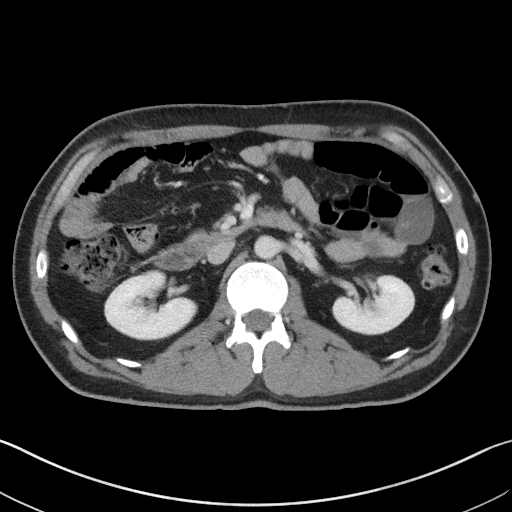
[im 74/107  soft-tissue]
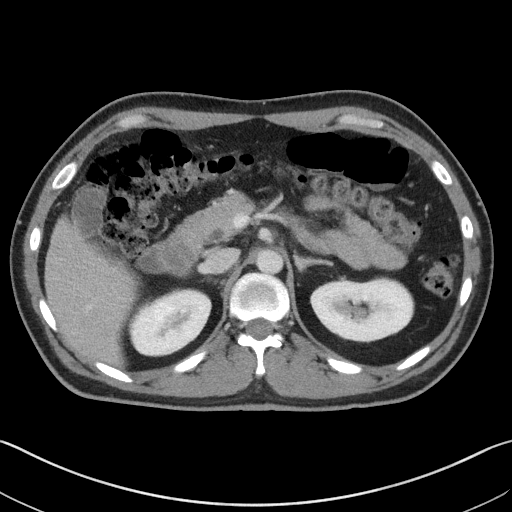
[im 74/107  bone]
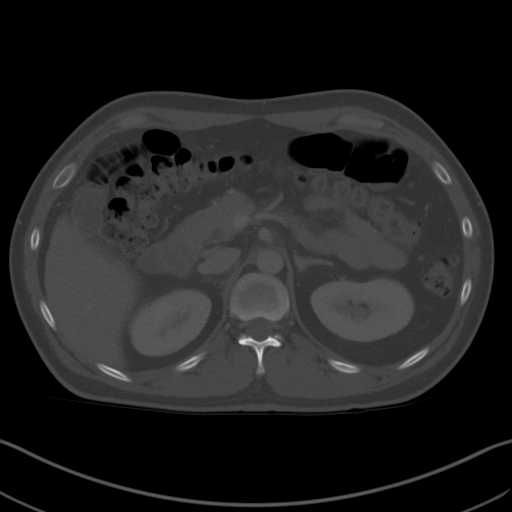
[im 82/107  soft-tissue]
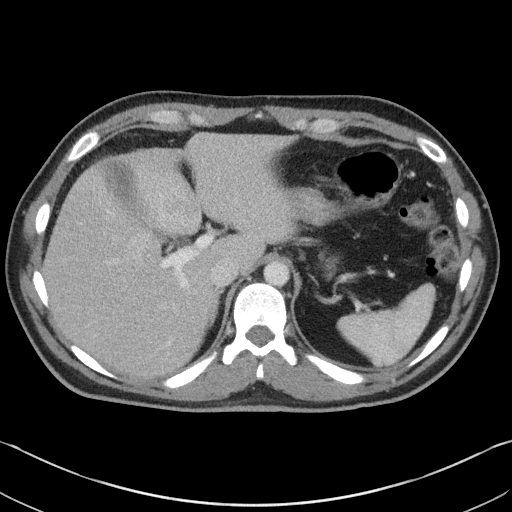
[im 90/107  soft-tissue]
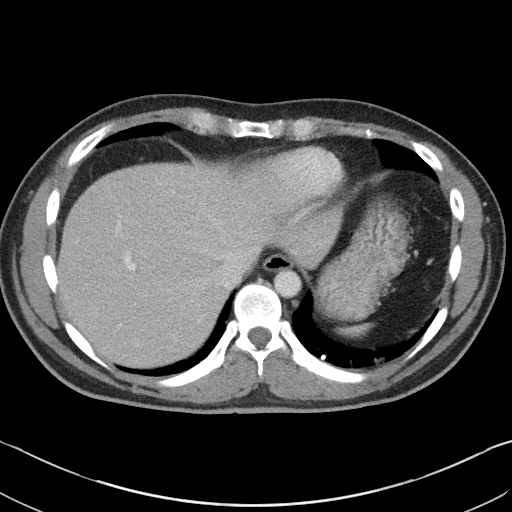
[im 98/107  soft-tissue]
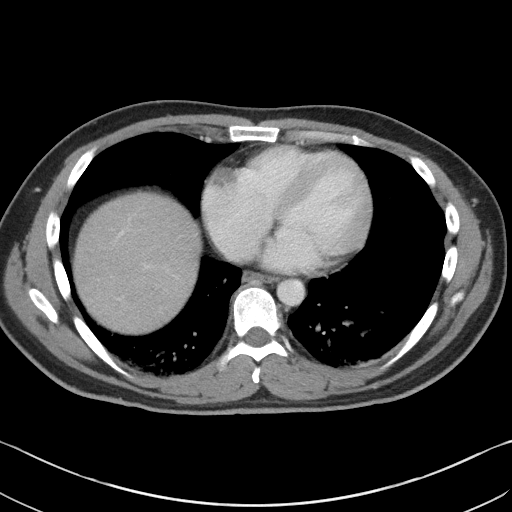

[Series 5: coronal st · coronal · 0.65mm/px · 3 of 78 slices shown]
[im 26/78  soft-tissue]
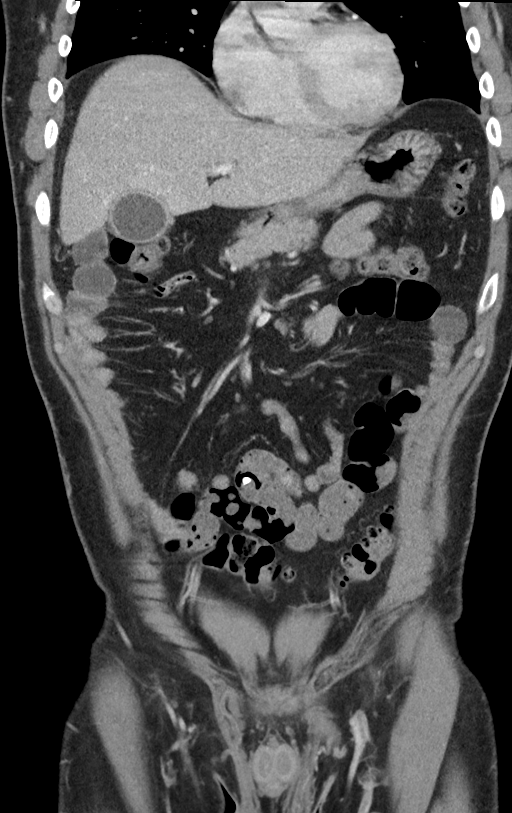
[im 35/78  soft-tissue]
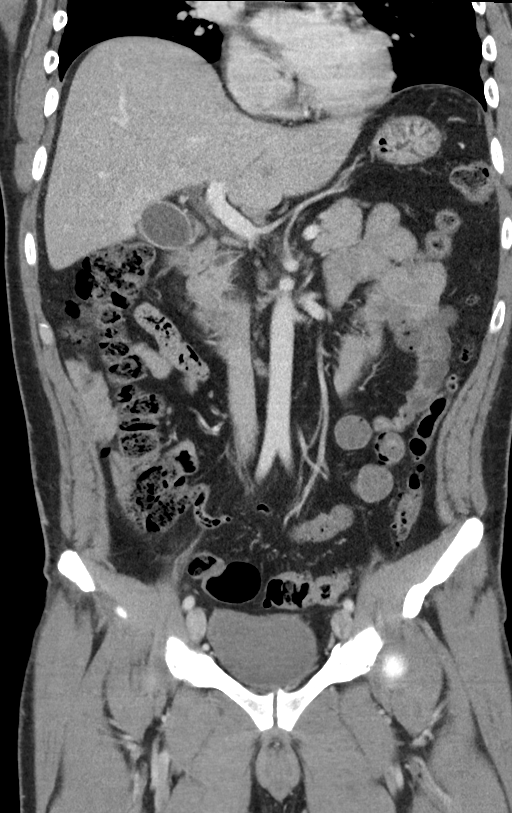
[im 43/78  soft-tissue]
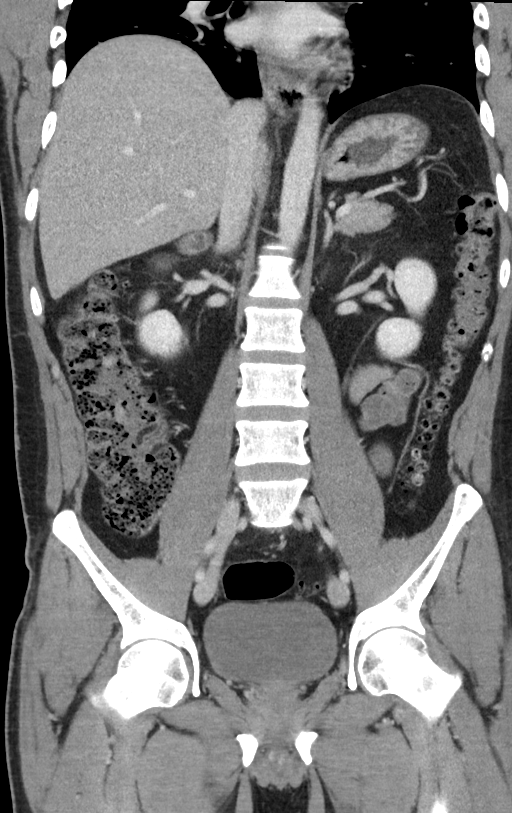

[15 of 46 positions shown; findings below may reference images not displayed]

FINDINGS: Lower chest: Lung bases are clear.

Hepatobiliary: No focal hepatic lesion. No intrahepatic biliary duct
dilatation. Gallbladder is nondistended measuring 3 cm in diameter;
however, there is pericholecystic fluid (image [DATE]). There is mild
hyperemia of the gallbladder wall. No echogenic gallstones are
present. Common bile duct is normal caliber.

Pancreas: Pancreas is normal. No ductal dilatation. No pancreatic
inflammation.

Spleen: Normal spleen

Adrenals/urinary tract: Adrenal glands and kidneys are normal. The
ureters and bladder normal.

Stomach/Bowel: Stomach, small bowel, appendix, and cecum are normal.
Diverticula of the descending colon sigmoid colon without acute
inflammation.

Vascular/Lymphatic: Abdominal aorta is normal caliber. No periportal
or retroperitoneal adenopathy. No pelvic adenopathy.

Reproductive: Prostate normal

Other: No free fluid.

Musculoskeletal: No aggressive osseous lesion. Bilateral pars
defects at L5 with grade 1 anterolisthesis of L5 on S1
IMPRESSION: 1. Small amount of fluid surrounding the gallbladder. Recommend
clinical correlation for acute cholecystitis. No radiodense
gallstones are present.
2. Normal appendix.
3. No obstructive uropathy.
4. Descending colon sigmoid colon diverticulosis without evidence of
diverticulitis.
5. Bilateral pars defects at L5 with grade 1 anterolisthesis.

## 2023-11-21 ENCOUNTER — Ambulatory Visit (INDEPENDENT_AMBULATORY_CARE_PROVIDER_SITE_OTHER): Payer: Self-pay | Admitting: Gastroenterology

## 2023-11-21 ENCOUNTER — Encounter: Payer: Self-pay | Admitting: *Deleted

## 2023-11-21 ENCOUNTER — Encounter: Payer: Self-pay | Admitting: Gastroenterology

## 2023-11-21 ENCOUNTER — Other Ambulatory Visit: Payer: Self-pay | Admitting: *Deleted

## 2023-11-21 VITALS — BP 135/85 | HR 106 | Temp 98.2°F | Wt 182.6 lb

## 2023-11-21 DIAGNOSIS — K5900 Constipation, unspecified: Secondary | ICD-10-CM

## 2023-11-21 DIAGNOSIS — R6881 Early satiety: Secondary | ICD-10-CM

## 2023-11-21 DIAGNOSIS — K219 Gastro-esophageal reflux disease without esophagitis: Secondary | ICD-10-CM

## 2023-11-21 DIAGNOSIS — R1010 Upper abdominal pain, unspecified: Secondary | ICD-10-CM

## 2023-11-21 DIAGNOSIS — R1011 Right upper quadrant pain: Secondary | ICD-10-CM

## 2023-11-21 DIAGNOSIS — K625 Hemorrhage of anus and rectum: Secondary | ICD-10-CM

## 2023-11-21 DIAGNOSIS — K641 Second degree hemorrhoids: Secondary | ICD-10-CM

## 2023-11-21 DIAGNOSIS — Z8719 Personal history of other diseases of the digestive system: Secondary | ICD-10-CM

## 2023-11-21 MED ORDER — HYDROCORTISONE (PERIANAL) 2.5 % EX CREA: 1.0000 | TOPICAL_CREAM | Freq: Two times a day (BID) | CUTANEOUS | 1 refills | Status: DC

## 2023-11-21 MED ORDER — PEG 3350-KCL-NA BICARB-NACL 420 G PO SOLR: 4000.0000 mL | Freq: Once | ORAL | 0 refills | Status: AC

## 2023-11-21 MED ORDER — PANTOPRAZOLE SODIUM 40 MG PO TBEC: 40.0000 mg | DELAYED_RELEASE_TABLET | Freq: Every day | ORAL | 3 refills | Status: AC

## 2023-11-21 MED ORDER — SUCRALFATE 1 G PO TABS: 1.0000 g | ORAL_TABLET | Freq: Three times a day (TID) | ORAL | 0 refills | Status: AC

## 2023-11-21 NOTE — Progress Notes (Addendum)
 GI Office Note    Referring Provider: Tylene Fantasia., PA-C Primary Care Physician:  Tylene Fantasia., PA-C  Primary Gastroenterologist: Dolores Frame, MD  Chief Complaint   Chief Complaint  Patient presents with   Abdominal Pain    Stomach pain and has hemorrhoids. Has some constipations    History of Present Illness   Arthur Walker is a 42 y.o. male presenting today at the request of Muse, Rochelle D., PA-C for GERD, RUQ and LUQ pain, constipation, and rectal bleeding.  Per referral patient reported constant stomach swelling for about 6 weeks and his upper abdomen.  He reports chronic constipation since 2015 as well as external hemorrhoids.  He endorsed decreased appetite for about 6 weeks.  He states he has been having 1-2 bowel movements per day with bright red blood when he wipes as well as some blood in the toilet.  Stool is soft/formed and light brown in color.  He does report having to strain with bowel movements.  Patient reports almost everything he eats causes bloating and gassiness, especially tomato based products and onions.  He does note his symptoms are improved with flatulence and belching.  He complains of worsening heartburn and regurgitation for about a month.  He denied any family history of colon cancer.  He admitted to drinking 48-60 ounces of Starbucks cold coffee a week, 40 ounces of iced tea per day, about 64 ounces of alcohol daily (beer).  He also states he drinks about 40 ounces of water a day.  Also endorsed lack of appetite.  Labs 11/06/2023: HDL 68, total cholesterol 161, triglycerides 151, LDL 212.  Normal LFTs.  Normal creatinine.  Hemoglobin 16.9, MCV 96.   Today: Constipation - He has incomplete emptying. Goes daily mostly. Stool is hard and does have to strain. Sometimes has hard balls and some is bristol 4 stools. Does not have lower abdominal pain.   Upper abdominal pain - He can't eat or drink a lot because he has upper  abdominal pain. He had an event about 2 months ago and hasn't had issues since then. Happened very early in the morning and does not remember what he ate the night prior. Denis nausea or vomiting. Denis overt dysphagia. Has early satiety. He reports inflammation when eating. Pain is not cramping but more like sharp and pointing. He avoids spicy but he does eat fatty foods daily. Daily  2 (32 oz beers daily). No smoking, vaping.  Denies NSAIDs.   He states he has not taken it because he felt like that made it worse.   Hemorrhoids - He feels balls of tissue that come out and they are painful and he has to sit and hurts to walk and after a while they go back in. When he does hygiene he can feel it and sometimes he has to push it back in. Has blood mostly on toilet tissue and in the commode and sometimes around the stool as well.   Wt Readings from Last 3 Encounters:  11/21/23 182 lb 9.6 oz (82.8 kg)  11/27/18 163 lb 6.4 oz (74.1 kg)  11/08/18 162 lb 4.1 oz (73.6 kg)   Current Outpatient Medications  Medication Sig Dispense Refill   atorvastatin (LIPITOR) 10 MG tablet Take 10 mg by mouth daily.     HYDROcodone-acetaminophen (NORCO/VICODIN) 5-325 MG tablet Take 1 tablet by mouth every 4 (four) hours as needed. (Patient not taking: Reported on 11/21/2023) 30 tablet 0   omeprazole (PRILOSEC) 20  MG capsule Take 1 capsule (20 mg total) by mouth daily. (Patient not taking: Reported on 11/21/2023) 14 capsule 0   ondansetron (ZOFRAN) 4 MG tablet Take 1 tablet (4 mg total) by mouth every 6 (six) hours. (Patient not taking: Reported on 11/21/2023) 10 tablet 0   No current facility-administered medications for this visit.    Past Medical History:  Diagnosis Date   Acid reflux     Past Surgical History:  Procedure Laterality Date   CHOLECYSTECTOMY N/A 11/09/2018   Procedure: LAPAROSCOPIC CHOLECYSTECTOMY;  Surgeon: Franky Macho, MD;  Location: [facility];  Service: General;  Laterality: N/A;    Family  History  Problem Relation Age of Onset   Diabetes Mother    Hypertension Mother    Diabetes Father    Diabetes Maternal Uncle    Hypertension Maternal Uncle     Allergies as of 11/21/2023   (No Known Allergies)    Social History   Socioeconomic History   Marital status: Single    Spouse name: Not on file   Number of children: Not on file   Years of education: Not on file   Highest education level: Not on file  Occupational History   Not on file  Tobacco Use   Smoking status: Former    Current packs/day: 0.00    Average packs/day: 0.3 packs/day for 17.0 years (4.3 ttl pk-yrs)    Types: Cigarettes    Start date: 01/31/2000    Quit date: 01/30/2017    Years since quitting: 6.8   Smokeless tobacco: Never  Vaping Use   Vaping status: Never Used  Substance and Sexual Activity   Alcohol use: Yes    Alcohol/week: 2.0 standard drinks of alcohol    Types: 2 Cans of beer per week    Comment: 2  32oz beer daily   Drug use: No   Sexual activity: Not on file  Other Topics Concern   Not on file  Social History Narrative   Not on file   Social Drivers of Health   Financial Resource Strain: Not on file  Food Insecurity: Not on file  Transportation Needs: Not on file  Physical Activity: Not on file  Stress: Not on file  Social Connections: Not on file  Intimate Partner Violence: Not on file     Review of Systems   Gen: Denies any fever, chills, fatigue, weight loss, lack of appetite.  CV: Denies chest pain, heart palpitations, peripheral edema, syncope.  Resp: Denies shortness of breath at rest or with exertion. Denies wheezing or cough.  GI: see HPI GU : Denies urinary burning, urinary frequency, urinary hesitancy MS: Denies joint pain, muscle weakness, cramps, or limitation of movement.  Derm: Denies rash, itching, dry skin Psych: Denies depression, anxiety, memory loss, and confusion Heme: Denies bruising, bleeding, and enlarged lymph nodes.  Physical Exam   BP  135/85 (BP Location: Right Arm, Patient Position: Sitting, Cuff Size: Large)   Pulse (!) 106   Temp 98.2 F (36.8 C) (Temporal)   Wt 182 lb 9.6 oz (82.8 kg)   BMI 29.47 kg/m   General:   Alert and oriented. Pleasant and cooperative. Well-nourished and well-developed.  Head:  Normocephalic and atraumatic. Eyes:  Without icterus, sclera clear and conjunctiva pink.  Ears:  Normal auditory acuity. Mouth:  No deformity or lesions, oral mucosa pink.  Lungs:  Clear to auscultation bilaterally. No wheezes, rales, or rhonchi. No distress.  Heart:  S1, S2 present without murmurs appreciated.  Abdomen:  +  BS, soft, non-distended.  TTP to LLQ, LUQ, epigastrium, and RUQ.  No HSM noted. No guarding or rebound. No masses appreciated.  Rectal: Good sphincter tone.  No evidence of stool in the rectal vault.  No evidence of rectal mass.  Does have palpable hemorrhoid tissue to at least the left lateral column, possibly right posterior column. Msk:  Symmetrical without gross deformities. Normal posture. Extremities:  Without edema. Neurologic:  Alert and  oriented x4;  grossly normal neurologically. Skin:  Intact without significant lesions or rashes. Psych:  Alert and cooperative. Normal mood and affect.  Assessment   Arthur Walker is a 42 y.o. male with a history of HTN, GERD, chronic constipation, diverticulitis in 2012 treated at [facility], cholecystitis s/p cholecystectomy in 2020 presenting today with multiple GI complaints.  GERD, upper abdominal pain, early satiety: Has had ongoing acid reflux symptoms.  States he tried pantoprazole once and felt as though the inflammation was worse so he stopped taking it.  Shortly after eating he begins having pain and what feels like inflammation so therefore he stopped eating.  He does admit to drinking about 64 ounces of beer daily.  Denies any NSAID use or tobacco use. Upper abdominal pain likely secondary to alcohol use, most likely gastritis.  Provided  extensive education during visit today regarding need for alcohol cessation and that he needs PPI to help in healing process.  Will give short course of Carafate to also help with symptom relief.  He is agreeable to proceed with upper endoscopy for further evaluation to rule out H. pylori, esophagitis, gastritis, duodenitis, peptic ulcer disease.  Constipation: Has struggled with straining as well as intermittent hard stools.  This was even before attempting to take PPI which he felt like made his constipation worse.  Advise MiraLAX once daily in 8 ounces of water.  Will augment his colonoscopy prep with Linzess.  Hemorrhoids, rectal bleeding, hx of diverticulitis: Has had intermittent rectal bleeding with bowel movements.  He reports that he felt he has external hemorrhoids however today on exam he did not have any evidence of external hemorrhoids.  He likely is having grade 2/prolapsing hemorrhoids that are bleeding secondary to constipation and straining.  He did have some palpable hemorrhoid tissue on exam today.  Sent in Anusol cream to use twice daily internally.  Discussed potential hemorrhoid banding today.  PLAN   Proceed with upper endoscopy and colonoscopy with propofol by Dr. Levon Hedger in near future: the risks, benefits, and alternatives have been discussed with the patient in detail. The patient states understanding and desires to proceed. ASA 3 (alcohol abuse)  Linzess daily for 4 days prior. Miralax 17g once daily.  RUQ [place] Pantoprazole 40 mg daily Carafate 1g QID GERD diet Alcohol cessation Potential for hemorrhoid banding.  Pamphlet in Spanish provided. Anusol twice daily for 1 week Follow up after proceures.   Brooke Bonito, MSN, FNP-BC, AGACNP-BC [facility]  I have reviewed the note and agree with the APP's assessment as described in this progress note  Katrinka Blazing, MD Gastroenterology and Hepatology [facility]  Gastroenterology

## 2023-11-21 NOTE — Patient Instructions (Addendum)
 Siga una dieta para la ERGE:  Evite los alimentos fritos, grasos, grasosos, picantes y ctricos. Evite la cafena y las bebidas carbonatadas. Evite el chocolate. Intente comer de 4 a 6 comidas pequeas al geophysical data processor de 3 comidas grandes. No coma dentro de las 3 horas posteriores a teacher, music. Apoye la cabecera de la cama sobre madera o ladrillos para crear una inclinacin de 6 pulgadas.  Iniciar pantoprazol 40 mg una vez al da, 30 minutos antes del desayuno.  Tomar Carafate  1 g 4 veces al da durante 2 semanas. (Esto ser antes de las comidas y luego antes de [place]).  Comience a tomar MiraLAX 17 g (1 tapa) al da en 8 onzas de agua.  Le envi la crema Anusol  para que la google veces al da en el recto.  Deber utilizar el aplicador para insertarlo en el recto dos veces al da para ayudar a tratar las hemorroides.  Nos comunicaremos con usted para programarle una endoscopia superior y una colonoscopia con el Dr. Baron.  Tambin lo llamaremos para programar su ultrasonido del cuadrante superior derecho.   Espero que tengas un feliz ao nuevo!  Fue un arboriculturist. Quiero crear relaciones de confianza con los pacientes. Si recibe motorola su visita, le agradezco mucho que se tome el tiempo para completarla en papel o a travs de MyChart. Valoro tus comentarios.  Arthur Melia, MSN, FNP-BC, AGACNP-BC [facility]   ENGLISH: Follow a GERD diet:  Avoid fried, fatty, greasy, spicy, citrus foods. Avoid caffeine and carbonated beverages. Avoid chocolate. Try eating 4-6 small meals a day rather than 3 large meals. Do not eat within 3 hours of laying down. Prop head of bed up on wood or bricks to create a 6 inch incline.  Starting pantoprazole  40 mg once daily, 30 minutes prior to breakfast.  Take Carafate  1 g 4 times a day for 2 weeks. (This will be before meals and then prior to bedtime).  Start taking MiraLAX 17 g (1  capful) daily in 8 ounces of water.  I have sent in Anusol  cream for you to apply twice daily to your rectum.  You will need to use the applicator to insert into your rectum twice a day to help treat hemorrhoids.  We will contact you to get you scheduled for an upper endoscopy and colonoscopy with Dr. Eartha.  We will also be calling you to schedule your right upper quadrant ultrasound.   I hope you have a happy new year!  It was a pleasure to see you today. I want to create trusting relationships with patients. If you receive a survey regarding your visit,  I greatly appreciate you taking time to fill this out on paper or through your MyChart. I value your feedback.  Arthur Melia, MSN, FNP-BC, AGACNP-BC [facility]

## 2023-11-21 NOTE — H&P (View-Only) (Signed)
GI Office Note    Referring Provider: Tylene Fantasia., PA-C Primary Care Physician:  Tylene Fantasia., PA-C  Primary Gastroenterologist: Dolores Frame, MD  Chief Complaint   Chief Complaint  Patient presents with   Abdominal Pain    Stomach pain and has hemorrhoids. Has some constipations    History of Present Illness   Arthur Walker is a 42 y.o. male presenting today at the request of Muse, Rochelle D., PA-C for GERD, RUQ and LUQ pain, constipation, and rectal bleeding.  Per referral patient reported constant stomach swelling for about 6 weeks and his upper abdomen.  He reports chronic constipation since 2015 as well as external hemorrhoids.  He endorsed decreased appetite for about 6 weeks.  He states he has been having 1-2 bowel movements per day with bright red blood when he wipes as well as some blood in the toilet.  Stool is soft/formed and light brown in color.  He does report having to strain with bowel movements.  Patient reports almost everything he eats causes bloating and gassiness, especially tomato based products and onions.  He does note his symptoms are improved with flatulence and belching.  He complains of worsening heartburn and regurgitation for about a month.  He denied any family history of colon cancer.  He admitted to drinking 48-60 ounces of Starbucks cold coffee a week, 40 ounces of iced tea per day, about 64 ounces of alcohol daily (beer).  He also states he drinks about 40 ounces of water a day.  Also endorsed lack of appetite.  Labs 11/06/2023: HDL 68, total cholesterol 161, triglycerides 151, LDL 212.  Normal LFTs.  Normal creatinine.  Hemoglobin 16.9, MCV 96.   Today: Constipation - He has incomplete emptying. Goes daily mostly. Stool is hard and does have to strain. Sometimes has hard balls and some is bristol 4 stools. Does not have lower abdominal pain.   Upper abdominal pain - He can't eat or drink a lot because he has upper  abdominal pain. He had an event about 2 months ago and hasn't had issues since then. Happened very early in the morning and does not remember what he ate the night prior. Denis nausea or vomiting. Denis overt dysphagia. Has early satiety. He reports inflammation when eating. Pain is not cramping but more like sharp and pointing. He avoids spicy but he does eat fatty foods daily. Daily  2 (32 oz beers daily). No smoking, vaping.  Denies NSAIDs.   He states he has not taken it because he felt like that made it worse.   Hemorrhoids - He feels balls of tissue that come out and they are painful and he has to sit and hurts to walk and after a while they go back in. When he does hygiene he can feel it and sometimes he has to push it back in. Has blood mostly on toilet tissue and in the commode and sometimes around the stool as well.   Wt Readings from Last 3 Encounters:  11/21/23 182 lb 9.6 oz (82.8 kg)  11/27/18 163 lb 6.4 oz (74.1 kg)  11/08/18 162 lb 4.1 oz (73.6 kg)   Current Outpatient Medications  Medication Sig Dispense Refill   atorvastatin (LIPITOR) 10 MG tablet Take 10 mg by mouth daily.     HYDROcodone-acetaminophen (NORCO/VICODIN) 5-325 MG tablet Take 1 tablet by mouth every 4 (four) hours as needed. (Patient not taking: Reported on 11/21/2023) 30 tablet 0   omeprazole (PRILOSEC) 20  MG capsule Take 1 capsule (20 mg total) by mouth daily. (Patient not taking: Reported on 11/21/2023) 14 capsule 0   ondansetron (ZOFRAN) 4 MG tablet Take 1 tablet (4 mg total) by mouth every 6 (six) hours. (Patient not taking: Reported on 11/21/2023) 10 tablet 0   No current facility-administered medications for this visit.    Past Medical History:  Diagnosis Date   Acid reflux     Past Surgical History:  Procedure Laterality Date   CHOLECYSTECTOMY N/A 11/09/2018   Procedure: LAPAROSCOPIC CHOLECYSTECTOMY;  Surgeon: Franky Macho, MD;  Location: [facility];  Service: General;  Laterality: N/A;    Family  History  Problem Relation Age of Onset   Diabetes Mother    Hypertension Mother    Diabetes Father    Diabetes Maternal Uncle    Hypertension Maternal Uncle     Allergies as of 11/21/2023   (No Known Allergies)    Social History   Socioeconomic History   Marital status: Single    Spouse name: Not on file   Number of children: Not on file   Years of education: Not on file   Highest education level: Not on file  Occupational History   Not on file  Tobacco Use   Smoking status: Former    Current packs/day: 0.00    Average packs/day: 0.3 packs/day for 17.0 years (4.3 ttl pk-yrs)    Types: Cigarettes    Start date: 01/31/2000    Quit date: 01/30/2017    Years since quitting: 6.8   Smokeless tobacco: Never  Vaping Use   Vaping status: Never Used  Substance and Sexual Activity   Alcohol use: Yes    Alcohol/week: 2.0 standard drinks of alcohol    Types: 2 Cans of beer per week    Comment: 2  32oz beer daily   Drug use: No   Sexual activity: Not on file  Other Topics Concern   Not on file  Social History Narrative   Not on file   Social Drivers of Health   Financial Resource Strain: Not on file  Food Insecurity: Not on file  Transportation Needs: Not on file  Physical Activity: Not on file  Stress: Not on file  Social Connections: Not on file  Intimate Partner Violence: Not on file     Review of Systems   Gen: Denies any fever, chills, fatigue, weight loss, lack of appetite.  CV: Denies chest pain, heart palpitations, peripheral edema, syncope.  Resp: Denies shortness of breath at rest or with exertion. Denies wheezing or cough.  GI: see HPI GU : Denies urinary burning, urinary frequency, urinary hesitancy MS: Denies joint pain, muscle weakness, cramps, or limitation of movement.  Derm: Denies rash, itching, dry skin Psych: Denies depression, anxiety, memory loss, and confusion Heme: Denies bruising, bleeding, and enlarged lymph nodes.  Physical Exam   BP  135/85 (BP Location: Right Arm, Patient Position: Sitting, Cuff Size: Large)   Pulse (!) 106   Temp 98.2 F (36.8 C) (Temporal)   Wt 182 lb 9.6 oz (82.8 kg)   BMI 29.47 kg/m   General:   Alert and oriented. Pleasant and cooperative. Well-nourished and well-developed.  Head:  Normocephalic and atraumatic. Eyes:  Without icterus, sclera clear and conjunctiva pink.  Ears:  Normal auditory acuity. Mouth:  No deformity or lesions, oral mucosa pink.  Lungs:  Clear to auscultation bilaterally. No wheezes, rales, or rhonchi. No distress.  Heart:  S1, S2 present without murmurs appreciated.  Abdomen:  +  BS, soft, non-distended.  TTP to LLQ, LUQ, epigastrium, and RUQ.  No HSM noted. No guarding or rebound. No masses appreciated.  Rectal: Good sphincter tone.  No evidence of stool in the rectal vault.  No evidence of rectal mass.  Does have palpable hemorrhoid tissue to at least the left lateral column, possibly right posterior column. Msk:  Symmetrical without gross deformities. Normal posture. Extremities:  Without edema. Neurologic:  Alert and  oriented x4;  grossly normal neurologically. Skin:  Intact without significant lesions or rashes. Psych:  Alert and cooperative. Normal mood and affect.  Assessment   Stevens Magwood is a 42 y.o. male with a history of HTN, GERD, chronic constipation, diverticulitis in 2012 treated at [facility], cholecystitis s/p cholecystectomy in 2020 presenting today with multiple GI complaints.  GERD, upper abdominal pain, early satiety: Has had ongoing acid reflux symptoms.  States he tried pantoprazole once and felt as though the inflammation was worse so he stopped taking it.  Shortly after eating he begins having pain and what feels like inflammation so therefore he stopped eating.  He does admit to drinking about 64 ounces of beer daily.  Denies any NSAID use or tobacco use. Upper abdominal pain likely secondary to alcohol use, most likely gastritis.  Provided  extensive education during visit today regarding need for alcohol cessation and that he needs PPI to help in healing process.  Will give short course of Carafate to also help with symptom relief.  He is agreeable to proceed with upper endoscopy for further evaluation to rule out H. pylori, esophagitis, gastritis, duodenitis, peptic ulcer disease.  Constipation: Has struggled with straining as well as intermittent hard stools.  This was even before attempting to take PPI which he felt like made his constipation worse.  Advise MiraLAX once daily in 8 ounces of water.  Will augment his colonoscopy prep with Linzess.  Hemorrhoids, rectal bleeding, hx of diverticulitis: Has had intermittent rectal bleeding with bowel movements.  He reports that he felt he has external hemorrhoids however today on exam he did not have any evidence of external hemorrhoids.  He likely is having grade 2/prolapsing hemorrhoids that are bleeding secondary to constipation and straining.  He did have some palpable hemorrhoid tissue on exam today.  Sent in Anusol cream to use twice daily internally.  Discussed potential hemorrhoid banding today.  PLAN   Proceed with upper endoscopy and colonoscopy with propofol by Dr. Levon Hedger in near future: the risks, benefits, and alternatives have been discussed with the patient in detail. The patient states understanding and desires to proceed. ASA 3 (alcohol abuse)  Linzess daily for 4 days prior. Miralax 17g once daily.  RUQ [place] Pantoprazole 40 mg daily Carafate 1g QID GERD diet Alcohol cessation Potential for hemorrhoid banding.  Pamphlet in Spanish provided. Anusol twice daily for 1 week Follow up after proceures.   Brooke Bonito, MSN, FNP-BC, AGACNP-BC [facility]  I have reviewed the note and agree with the APP's assessment as described in this progress note  Katrinka Blazing, MD Gastroenterology and Hepatology [facility]  Gastroenterology

## 2023-11-23 ENCOUNTER — Encounter: Payer: Self-pay | Admitting: *Deleted

## 2023-11-28 ENCOUNTER — Telehealth: Payer: Self-pay | Admitting: Internal Medicine

## 2023-11-28 ENCOUNTER — Ambulatory Visit (HOSPITAL_COMMUNITY): Payer: Self-pay

## 2023-11-28 NOTE — Telephone Encounter (Signed)
 Patient's friend, Lum, came in, she helps with translation for him.  She said he went to [facility] today for his appt at 9:30 and was told he wasn't scheduled for an appt today.  It is in the computer and don't understand why he was told that.  He would like to reschedule the appt.

## 2023-11-28 NOTE — Telephone Encounter (Signed)
 Pt has been rescheduled until Tuesday, 06/03/24, arrive at 7:15 am to check in, NPO after midnight. Pt was informed by his friend Satira Mccallum, who did the translation for him. Number give to CS in case pt needed to reschedule.

## 2023-12-05 ENCOUNTER — Ambulatory Visit (HOSPITAL_COMMUNITY)
Admission: RE | Admit: 2023-12-05 | Discharge: 2023-12-05 | Disposition: A | Discharge status: Home / Self Care | Payer: Self-pay | Source: Ambulatory Visit | Attending: Gastroenterology | Admitting: Gastroenterology

## 2023-12-05 DIAGNOSIS — R1011 Right upper quadrant pain: Secondary | ICD-10-CM | POA: Insufficient documentation

## 2023-12-08 ENCOUNTER — Encounter (HOSPITAL_COMMUNITY)
Admission: RE | Admit: 2023-12-08 | Discharge: 2023-12-08 | Disposition: A | Discharge status: Home / Self Care | Payer: Self-pay | Source: Ambulatory Visit | Attending: Gastroenterology | Admitting: Gastroenterology

## 2023-12-08 ENCOUNTER — Encounter (HOSPITAL_COMMUNITY): Payer: Self-pay

## 2023-12-08 DIAGNOSIS — K625 Hemorrhage of anus and rectum: Secondary | ICD-10-CM

## 2023-12-08 DIAGNOSIS — R7309 Other abnormal glucose: Secondary | ICD-10-CM

## 2023-12-11 ENCOUNTER — Encounter: Payer: Self-pay | Admitting: *Deleted

## 2023-12-11 ENCOUNTER — Telehealth: Payer: Self-pay | Admitting: *Deleted

## 2023-12-11 NOTE — Telephone Encounter (Signed)
Pt was informed by Mindy E of new pre-op date on 12/13/23 at 3:15 pm     Nadean Corwin, Marico Buckle L, LPN I gave him a new PAT and put him back on the schedule.

## 2023-12-11 NOTE — Telephone Encounter (Signed)
Message Received: Today Young, Torrie Mayers, Gordy Goar L, LPN She still didn't show up for the PAT.  We either need to cancel or reschedule.  She is in the depot now.       Previous Messages    ----- Message ----- From: Elinor Dodge, LPN Sent: [id number]   7:29 AM EST To: Nobie Putnam  That would have been 12/05/23 and that was for his Ultrasound not his procedure. ----- Message ----- From: Nobie Putnam Sent: 12/08/2023   1:53 PM EST To: Elinor Dodge, LPN  Hey,  The staff is telling me that an interpreter showed up today for this patient's PAT but the patient did not and they said there is a note in the chart that he has been moved to July.  Do you know what is going on with this patient.  I canceled the PAT and have put him in the depot, please let me know what to do with him.  Thanks,

## 2023-12-11 NOTE — Telephone Encounter (Signed)
Called pt. Aware of his new pre-op appt for 1/22 at 3:15pm. He voiced understanding

## 2023-12-13 ENCOUNTER — Encounter (HOSPITAL_COMMUNITY)
Admission: RE | Admit: 2023-12-13 | Discharge: 2023-12-13 | Disposition: A | Discharge status: Home / Self Care | Payer: Self-pay | Source: Ambulatory Visit | Attending: Gastroenterology | Admitting: Gastroenterology

## 2023-12-13 ENCOUNTER — Encounter (HOSPITAL_COMMUNITY): Payer: Self-pay

## 2023-12-13 DIAGNOSIS — R7309 Other abnormal glucose: Secondary | ICD-10-CM | POA: Insufficient documentation

## 2023-12-13 DIAGNOSIS — K625 Hemorrhage of anus and rectum: Secondary | ICD-10-CM | POA: Insufficient documentation

## 2023-12-13 DIAGNOSIS — Z01812 Encounter for preprocedural laboratory examination: Secondary | ICD-10-CM | POA: Insufficient documentation

## 2023-12-13 LAB — CBC WITH DIFFERENTIAL/PLATELET
Abs Immature Granulocytes: 0.15 10*3/uL — ABNORMAL HIGH (ref 0.00–0.07)
Basophils Absolute: 0.1 10*3/uL (ref 0.0–0.1)
Basophils Relative: 1 %
Eosinophils Absolute: 0.3 10*3/uL (ref 0.0–0.5)
Eosinophils Relative: 3 %
HCT: 47 % (ref 39.0–52.0)
Hemoglobin: 16.2 g/dL (ref 13.0–17.0)
Immature Granulocytes: 1 %
Lymphocytes Relative: 22 %
Lymphs Abs: 2.5 10*3/uL (ref 0.7–4.0)
MCH: 32.4 pg (ref 26.0–34.0)
MCHC: 34.5 g/dL (ref 30.0–36.0)
MCV: 94 fL (ref 80.0–100.0)
Monocytes Absolute: 1 10*3/uL (ref 0.1–1.0)
Monocytes Relative: 9 %
Neutro Abs: 7.4 10*3/uL (ref 1.7–7.7)
Neutrophils Relative %: 64 %
Platelets: 326 10*3/uL (ref 150–400)
RBC: 5 MIL/uL (ref 4.22–5.81)
RDW: 13 % (ref 11.5–15.5)
WBC: 11.3 10*3/uL — ABNORMAL HIGH (ref 4.0–10.5)
nRBC: 0 % (ref 0.0–0.2)

## 2023-12-13 LAB — BASIC METABOLIC PANEL
Anion gap: 9 (ref 5–15)
BUN: 15 mg/dL (ref 6–20)
CO2: 23 mmol/L (ref 22–32)
Calcium: 9.2 mg/dL (ref 8.9–10.3)
Chloride: 103 mmol/L (ref 98–111)
Creatinine, Ser: 0.8 mg/dL (ref 0.61–1.24)
GFR, Estimated: >60 mL/min (ref >60)
Glucose, Bld: 108 mg/dL — ABNORMAL HIGH (ref 70–99)
Potassium: 3.8 mmol/L (ref 3.5–5.1)
Sodium: 135 mmol/L (ref 135–145)

## 2023-12-15 ENCOUNTER — Encounter (HOSPITAL_COMMUNITY)
Admission: RE | Disposition: A | Discharge status: Home / Self Care | Payer: Self-pay | Source: Home / Self Care | Attending: Gastroenterology

## 2023-12-15 ENCOUNTER — Ambulatory Visit (HOSPITAL_BASED_OUTPATIENT_CLINIC_OR_DEPARTMENT_OTHER): Payer: Self-pay | Admitting: Certified Registered"

## 2023-12-15 ENCOUNTER — Ambulatory Visit (HOSPITAL_COMMUNITY): Payer: Self-pay | Admitting: Certified Registered"

## 2023-12-15 ENCOUNTER — Encounter (HOSPITAL_COMMUNITY): Payer: Self-pay | Admitting: Gastroenterology

## 2023-12-15 ENCOUNTER — Ambulatory Visit (HOSPITAL_COMMUNITY)
Admission: RE | Admit: 2023-12-15 | Discharge: 2023-12-15 | Disposition: A | Discharge status: Home / Self Care | Payer: Self-pay | Attending: Gastroenterology | Admitting: Gastroenterology

## 2023-12-15 DIAGNOSIS — R1010 Upper abdominal pain, unspecified: Secondary | ICD-10-CM | POA: Insufficient documentation

## 2023-12-15 DIAGNOSIS — K5900 Constipation, unspecified: Secondary | ICD-10-CM

## 2023-12-15 DIAGNOSIS — Z87891 Personal history of nicotine dependence: Secondary | ICD-10-CM | POA: Insufficient documentation

## 2023-12-15 DIAGNOSIS — K642 Third degree hemorrhoids: Secondary | ICD-10-CM | POA: Insufficient documentation

## 2023-12-15 DIAGNOSIS — Z8249 Family history of ischemic heart disease and other diseases of the circulatory system: Secondary | ICD-10-CM | POA: Insufficient documentation

## 2023-12-15 DIAGNOSIS — K219 Gastro-esophageal reflux disease without esophagitis: Secondary | ICD-10-CM | POA: Insufficient documentation

## 2023-12-15 DIAGNOSIS — D1803 Hemangioma of intra-abdominal structures: Secondary | ICD-10-CM | POA: Insufficient documentation

## 2023-12-15 DIAGNOSIS — Z79899 Other long term (current) drug therapy: Secondary | ICD-10-CM | POA: Insufficient documentation

## 2023-12-15 DIAGNOSIS — K649 Unspecified hemorrhoids: Secondary | ICD-10-CM

## 2023-12-15 DIAGNOSIS — K648 Other hemorrhoids: Secondary | ICD-10-CM

## 2023-12-15 DIAGNOSIS — K625 Hemorrhage of anus and rectum: Secondary | ICD-10-CM

## 2023-12-15 DIAGNOSIS — R140 Abdominal distension (gaseous): Secondary | ICD-10-CM

## 2023-12-15 DIAGNOSIS — I10 Essential (primary) hypertension: Secondary | ICD-10-CM | POA: Insufficient documentation

## 2023-12-15 DIAGNOSIS — K31A0 Gastric intestinal metaplasia, unspecified: Secondary | ICD-10-CM | POA: Insufficient documentation

## 2023-12-15 DIAGNOSIS — K5909 Other constipation: Secondary | ICD-10-CM | POA: Insufficient documentation

## 2023-12-15 HISTORY — PX: ESOPHAGOGASTRODUODENOSCOPY (EGD) WITH PROPOFOL: SHX5813

## 2023-12-15 HISTORY — PX: BIOPSY: SHX5522

## 2023-12-15 HISTORY — PX: POLYPECTOMY: SHX5525

## 2023-12-15 HISTORY — PX: COLONOSCOPY WITH PROPOFOL: SHX5780

## 2023-12-15 LAB — HM COLONOSCOPY

## 2023-12-15 SURGERY — COLONOSCOPY WITH PROPOFOL
Anesthesia: General

## 2023-12-15 MED ORDER — LACTATED RINGERS IV SOLN: INTRAVENOUS | Status: DC | PRN

## 2023-12-15 MED ORDER — PROPOFOL 10 MG/ML IV BOLUS
INTRAVENOUS | Status: DC | PRN
  Administered 2023-12-15: 100 mg via INTRAVENOUS
  Administered 2023-12-15: 50 mg via INTRAVENOUS

## 2023-12-15 MED ORDER — LINACLOTIDE 145 MCG PO CAPS: 145.0000 ug | ORAL_CAPSULE | Freq: Every day | ORAL | 3 refills | Status: AC

## 2023-12-15 MED ORDER — LIDOCAINE HCL (PF) 2 % IJ SOLN
INTRAMUSCULAR | Status: DC | PRN
  Administered 2023-12-15: 100 mg via INTRADERMAL

## 2023-12-15 MED ORDER — PROPOFOL 500 MG/50ML IV EMUL
INTRAVENOUS | Status: DC | PRN
  Administered 2023-12-15: 200 ug/kg/min via INTRAVENOUS

## 2023-12-15 NOTE — Op Note (Signed)
[facility] Patient Name: Arthur Walker Procedure Date: 12/15/2023 11:26 AM MRN: [id number] Date of Birth: 08-Aug-1981 Attending MD: Katrinka Blazing , , [id number] CSN: [id number] Age: 43 Admit Type: Outpatient Procedure:                Upper GI endoscopy Indications:              Upper abdominal pain Providers:                Katrinka Blazing, Sharyne Richters Referring MD:              Medicines:                Monitored Anesthesia Care Complications:            No immediate complications. Estimated Blood Loss:     Estimated blood loss: none. Procedure:                Pre-Anesthesia Assessment:                           - Prior to the procedure, a History and Physical                            was performed, and patient medications, allergies                            and sensitivities were reviewed. The patient's                            tolerance of previous anesthesia was reviewed.                           - The risks and benefits of the procedure and the                            sedation options and risks were discussed with the                            patient. All questions were answered and informed                            consent was obtained.                           - ASA Grade Assessment: II - A patient with mild                            systemic disease.                           After obtaining informed consent, the endoscope was                            passed under direct vision. Throughout the                            procedure, the patient's blood pressure,  pulse, and                            oxygen saturations were monitored continuously. The                            [id number]-[id number] ([id number]) scope was introduced through the                            mouth, and advanced to the second part of duodenum.                            The upper GI endoscopy was accomplished without                            difficulty. The patient  tolerated the procedure                            well. Scope In: 11:43:42 AM Scope Out: 11:49:25 AM Total Procedure Duration: 0 hours 5 minutes 43 seconds  Findings:      The esophagus was normal.      The gastroesophageal flap valve was visualized endoscopically and       classified as Hill Grade I (prominent fold, tight to endoscope).      The entire examined stomach was normal. Biopsies were taken with a cold       forceps for Helicobacter pylori testing.      The examined duodenum was normal. Biopsies were taken with a cold       forceps for histology. Impression:               - Normal esophagus.                           - Normal stomach. Biopsied.                           - Normal examined duodenum. Biopsied. Moderate Sedation:      Per Anesthesia Care Recommendation:           - Discharge patient to home (ambulatory).                           - Resume previous diet.                           - Await pathology results.                           - Can take IBGard as needed for bloating.                           - If persisting symptoms, proceed with GES. Procedure Code(s):        --- Professional ---                           [phone], Esophagogastroduodenoscopy, flexible,  transoral; with biopsy, single or multiple Diagnosis Code(s):        --- Professional ---                           R10.10, Upper abdominal pain, unspecified CPT copyright 2022 American Medical Association. All rights reserved. The codes documented in this report are preliminary and upon coder review may  be revised to meet current compliance requirements. Katrinka Blazing, MD Katrinka Blazing,  12/15/2023 12:09:54 PM This report has been signed electronically. Number of Addenda: 0

## 2023-12-15 NOTE — Anesthesia Procedure Notes (Signed)
Date/Time: 12/15/2023 11:39 AM  Performed by: Julian Reil, CRNAPre-anesthesia Checklist: Patient identified, Emergency Drugs available, Suction available and Patient being monitored Patient Re-evaluated:Patient Re-evaluated prior to induction Oxygen Delivery Method: Nasal cannula Induction Type: IV induction Placement Confirmation: positive ETCO2 Comments: Optiflow High Flow [place] O2 used.

## 2023-12-15 NOTE — Interval H&P Note (Signed)
History and Physical Interval Note:  12/15/2023 11:05 AM  Arthur Walker  has presented today for surgery, with the diagnosis of rectal bleeding,GERD, upper abdominal pain, early satiety.  The various methods of treatment have been discussed with the patient and family. After consideration of risks, benefits and other options for treatment, the patient has consented to  Procedure(s) with comments: COLONOSCOPY WITH PROPOFOL (N/A) - 1:00 pm, asa 3 ESOPHAGOGASTRODUODENOSCOPY (EGD) WITH PROPOFOL (N/A) - 1:00 pm, asa 3 as a surgical intervention.  The patient's history has been reviewed, patient examined, no change in status, stable for surgery.  I have reviewed the patient's chart and labs.  Questions were answered to the patient's satisfaction.     [name]

## 2023-12-15 NOTE — Transfer of Care (Signed)
Immediate Anesthesia Transfer of Care Note  Patient: Arthur Walker  Procedure(s) Performed: COLONOSCOPY WITH PROPOFOL ESOPHAGOGASTRODUODENOSCOPY (EGD) WITH PROPOFOL BIOPSY POLYPECTOMY  Patient Location: Short Stay  Anesthesia Type:General  Level of Consciousness: drowsy  Airway & Oxygen Therapy: Patient Spontanous Breathing  Post-op Assessment: Report given to RN and Post -op Vital signs reviewed and stable  Post vital signs: Reviewed and stable  Last Vitals:  Vitals Value Taken Time  BP    Temp    Pulse    Resp    SpO2      Last Pain:  Vitals:   12/15/23 1144  TempSrc:   PainSc: 0-No pain      Patients Stated Pain Goal: 5 (12/15/23 1118)  Complications: No notable events documented.

## 2023-12-15 NOTE — Interval H&P Note (Signed)
History and Physical Interval Note:  12/15/2023 11:20 AM  Arthur Walker  has presented today for surgery, with the diagnosis of rectal bleeding,GERD, upper abdominal pain, early satiety.  The various methods of treatment have been discussed with the patient and family. After consideration of risks, benefits and other options for treatment, the patient has consented to  Procedure(s) with comments: COLONOSCOPY WITH PROPOFOL (N/A) - 1:00 pm, asa 3 ESOPHAGOGASTRODUODENOSCOPY (EGD) WITH PROPOFOL (N/A) - 1:00 pm, asa 3 as a surgical intervention.  The patient's history has been reviewed, patient examined, no change in status, stable for surgery.  I have reviewed the patient's chart and labs.  Questions were answered to the patient's satisfaction.     [name]

## 2023-12-15 NOTE — Discharge Instructions (Addendum)
Vuelva a iniciar su dieta anterior Se le enviara un reporte de la patologia por correo Puede tomar IBGard si presenta hinchazon abdominal Su colonoscopia tuvo pobre preparacion - debe repetir la colonoscopia cuanto antes para tamizaje Empiece a tomar  Linzess 145 mcg cada dia. Si persiste con sangrado, considere realizar bandeamiento de hemorroides

## 2023-12-15 NOTE — Anesthesia Preprocedure Evaluation (Signed)
Anesthesia Evaluation  Patient identified by MRN, date of birth, ID band Patient awake    Reviewed: Allergy & Precautions, H&P , NPO status , Patient's Chart, lab work & pertinent test results, reviewed documented beta blocker date and time   Airway Mallampati: II  TM Distance: >3 FB Neck ROM: full    Dental no notable dental hx.    Pulmonary neg pulmonary ROS, former smoker   Pulmonary exam normal breath sounds clear to auscultation       Cardiovascular Exercise Tolerance: Good hypertension, negative cardio ROS  Rhythm:regular Rate:Normal     Neuro/Psych negative neurological ROS  negative psych ROS   GI/Hepatic negative GI ROS, Neg liver ROS,GERD  ,,  Endo/Other  negative endocrine ROS    Renal/GU negative Renal ROS  negative genitourinary   Musculoskeletal   Abdominal   Peds  Hematology negative hematology ROS (+)   Anesthesia Other Findings   Reproductive/Obstetrics negative OB ROS                             Anesthesia Physical Anesthesia Plan  ASA: 2  Anesthesia Plan: General   Post-op Pain Management:    Induction:   PONV Risk Score and Plan: Propofol infusion  Airway Management Planned:   Additional Equipment:   Intra-op Plan:   Post-operative Plan:   Informed Consent: I have reviewed the patients History and Physical, chart, labs and discussed the procedure including the risks, benefits and alternatives for the proposed anesthesia with the patient or authorized representative who has indicated his/her understanding and acceptance.     Dental Advisory Given  Plan Discussed with: CRNA  Anesthesia Plan Comments:        Anesthesia Quick Evaluation

## 2023-12-15 NOTE — Op Note (Signed)
[facility] Patient Name: Arthur Walker Procedure Date: 12/15/2023 11:25 AM MRN: [id number] Date of Birth: 08-May-1981 Attending MD: Katrinka Blazing , , [id number] CSN: [id number] Age: 43 Admit Type: Outpatient Procedure:                Colonoscopy Indications:              Rectal bleeding, Constipation Providers:                Katrinka Blazing, Sheran Fava, Dyann Ruddle Referring MD:              Medicines:                Monitored Anesthesia Care Complications:            No immediate complications. Estimated Blood Loss:     Estimated blood loss: none. Procedure:                Pre-Anesthesia Assessment:                           - Prior to the procedure, a History and Physical                            was performed, and patient medications, allergies                            and sensitivities were reviewed. The patient's                            tolerance of previous anesthesia was reviewed.                           - The risks and benefits of the procedure and the                            sedation options and risks were discussed with the                            patient. All questions were answered and informed                            consent was obtained.                           - ASA Grade Assessment: I - A normal, healthy                            patient.                           After obtaining informed consent, the colonoscope                            was passed under direct vision. Throughout the                            procedure, the patient's blood pressure, pulse, and  oxygen saturations were monitored continuously. The                            [id number]-HQ190L ([id number]) scope was introduced through                            the anus and advanced to the the cecum, identified                            by appendiceal orifice and ileocecal valve. The                            colonoscopy was performed without  difficulty. The                            patient tolerated the procedure well. The quality                            of the bowel preparation was poor. Scope In: 11:54:02 AM Scope Out: 12:06:56 PM Scope Withdrawal Time: 0 hours 7 minutes 59 seconds  Total Procedure Duration: 0 hours 12 minutes 54 seconds  Findings:      protruding internal hemorrhoids were found on perianal exam.      A 4 mm polyp was found in the transverse colon. The polyp was sessile.       The polyp was removed with a cold snare. Resection and retrieval were       complete.      Internal hemorrhoids were found during retroflexion. The hemorrhoids       were Grade III (internal hemorrhoids that prolapse but require manual       reduction). Impression:               - Preparation of the colon was poor.                           - protruding internal hemorrhoids found on perianal                            exam.                           - One 4 mm polyp in the transverse colon, removed                            with a cold snare. Resected and retrieved.                           - Internal hemorrhoids. Moderate Sedation:      Per Anesthesia Care Recommendation:           - Discharge patient to home (ambulatory).                           - Resume previous diet.                           - Await pathology  results.                           - Repeat colonoscopy at the next available                            appointment because the bowel preparation was poor.                            Will need a two day prep.                           - Start Linzess 145 mcg qday.                           - If persisting or worsening rectal bleeding, will                            proceed with hemorrhoidal banding Procedure Code(s):        --- Professional ---                           [phone], Colonoscopy, flexible; with removal of                            tumor(s), polyp(s), or other lesion(s) by snare                             technique Diagnosis Code(s):        --- Professional ---                           K64.2, Third degree hemorrhoids                           D12.3, Benign neoplasm of transverse colon (hepatic                            flexure or splenic flexure)                           K62.5, Hemorrhage of anus and rectum                           K59.00, Constipation, unspecified CPT copyright 2022 American Medical Association. All rights reserved. The codes documented in this report are preliminary and upon coder review may  be revised to meet current compliance requirements. Katrinka Blazing, MD Katrinka Blazing,  12/15/2023 12:16:35 PM This report has been signed electronically. Number of Addenda: 0

## 2023-12-18 ENCOUNTER — Encounter (HOSPITAL_COMMUNITY): Payer: Self-pay | Admitting: Gastroenterology

## 2023-12-18 LAB — SURGICAL PATHOLOGY

## 2023-12-18 NOTE — Anesthesia Postprocedure Evaluation (Signed)
Anesthesia Post Note  Patient: Goble Fudala Muegge  Procedure(s) Performed: COLONOSCOPY WITH PROPOFOL ESOPHAGOGASTRODUODENOSCOPY (EGD) WITH PROPOFOL BIOPSY POLYPECTOMY  Patient location during evaluation: Phase II Anesthesia Type: General Level of consciousness: awake Pain management: pain level controlled Vital Signs Assessment: post-procedure vital signs reviewed and stable Respiratory status: spontaneous breathing and respiratory function stable Cardiovascular status: blood pressure returned to baseline and stable Postop Assessment: no headache and no apparent nausea or vomiting Anesthetic complications: no Comments: Late entry   No notable events documented.   Last Vitals:  Vitals:   12/15/23 1118 12/15/23 1213  BP: (!) 129/94 115/76  Pulse: 79 96  Resp: 18 18  Temp: 36.6 C 37.6 C  SpO2: 93% 100%    Last Pain:  Vitals:   12/15/23 1213  TempSrc: Oral  PainSc: 0-No pain                 Windell Norfolk

## 2023-12-20 ENCOUNTER — Encounter (INDEPENDENT_AMBULATORY_CARE_PROVIDER_SITE_OTHER): Payer: Self-pay | Admitting: *Deleted

## 2023-12-20 ENCOUNTER — Encounter: Payer: Self-pay | Admitting: *Deleted

## 2023-12-20 NOTE — Progress Notes (Signed)
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

## 2024-01-25 ENCOUNTER — Telehealth (INDEPENDENT_AMBULATORY_CARE_PROVIDER_SITE_OTHER): Payer: Self-pay | Admitting: Gastroenterology

## 2024-01-25 MED ORDER — PEG 3350-KCL-NA BICARB-NACL 420 G PO SOLR: 4000.0000 mL | Freq: Once | ORAL | 0 refills | Status: AC

## 2024-01-25 NOTE — Telephone Encounter (Signed)
 Per TCS note from January- repeat TCS within 3 months for screening. Needs a two day prep. Contacted pt and scheduled him for 02/27/24 at 7:30am. Instructions will be mailed once pre op has been received. Prep sent to pharmacy.

## 2024-02-09 ENCOUNTER — Encounter (INDEPENDENT_AMBULATORY_CARE_PROVIDER_SITE_OTHER): Payer: Self-pay | Admitting: *Deleted

## 2024-02-22 NOTE — Patient Instructions (Signed)
 Instrucciones Para Antes de la Ciruga   Su ciruga est programada para-(your procedure is scheduled on)                                     02/27/2024 at 0600 am.   Arthur Walker - (Enter)    Por favor llame al [phone] si tiene algn problema en la maana de la ciruga. (Please call if you have any problems the morning of surgery.)                  Recuerde: (Remember)      Siga las instrucciones de preparacion de la oficina.   Tome estas medicinas en la maana de la ciruga con un SORBITO de agua (Take these meds the morning of surgery with a SIP of water)                             pantoprazole   Puede cepillarse los dientes en la maana de la [place]. (You may brush your teeth the morning of surgery)   No use joyas, maquillaje para los ojos, lpiz labial, locin corporal ni esmalte de uas oscuro, incluido el esmalte en gel, uas artificiales ni ningn otro tipo de [employer] uas naturales (dedos de manos y pies).  (Do not wear jewelry, eye makeup, lipstick, body lotion, or dark fingernail polish, including gel polish, artificial nails, or any other type of covering on natural nails (fingers and toes).   No puede usar desodorante. (You may wear deodorant)   Si va a ser ingresado despues de la ciruga, deje la maleta en el carro hasta que se le haya asignado una habitacin. (If you are to be admitted after surgery, leave suitcase in car until your room has been assigned.)   [name] que se les d de alta el mismo da no se les permitir manejar a casa.  (Patients discharged on the day of surgery will not be allowed to drive home)   Use ropa suelta y cmoda de regreso a casa. (Wear loose comfortable clothes for ride home)        Colonoscopa en los adultos, cuidados posteriores Colonoscopy, Adult, Care After [name], es normal tener lo siguiente: Una pequea cantidad de sangre  en la materia fecal (heces) durante 24 horas. Gases. Clicos leves o distensin en el vientre (abdomen). Siga estas instrucciones en su casa: El mdico podr darle ms instrucciones. Si tiene problemas, llame al mdico. Comida y bebida  Beba suficiente lquido para mantener el pis (la [place]) de color amarillo plido. Siga las instrucciones del mdico respecto de lo que no puede comer o beber. Reanude la dieta normal como se lo haya indicado el mdico. Evite los alimentos pesados o fritos que son difciles de Location manager. Actividad Haga reposo como se lo haya indicado el mdico. [place] y camine un poco cada 1 a 2 horas. Pida ayuda si se siente dbil o inestable. Retome sus actividades normales cuando el mdico le diga que es seguro. Para aliviar los clicos y la hinchazn:  Intente caminar por  la casa. Si se lo indican, pngase calor en el vientre. Hgalo como se lo haya indicado el mdico. Use la fuente de calor que el mdico le recomiende, como una compresa de calor hmedo o una almohadilla trmica. Coloque una toalla entre la piel y la fuente de Airline pilot. Aplique calor durante 20 a 30 minutos. Retire la fuente de calor si la piel se le pone de color rojo brillante. Esto es [employer]. Si no puede sentir dolor, calor o fro, tiene un mayor riesgo de [place]. Instrucciones generales Si le administraron un sedante durante el procedimiento, no conduzca ni use mquinas hasta que el mdico le indique que es seguro [place]. Un sedante es un medicamento que ayuda a [place]. Durante las primeras 24 horas despus del procedimiento: No firme documentos importantes. No beba alcohol. Haga sus actividades diarias ms lentamente que lo normal. Coma alimentos que sean blandos y fciles de Location manager. Use los medicamentos de venta libre y los recetados solamente como se lo haya indicado el mdico. Concurra a todas las visitas de seguimiento. Comunquese con un mdico si: Tiene sangre en la materia  fecal 2 o 3 das despus del procedimiento. Solicite ayuda de inmediato si: Hay ms que una pequea cantidad de [employer] materia fecal. Observa grandes grumos de tejido (cogulos de sangre) en la materia fecal. Tiene el abdomen hinchado. Tiene ganas de vomitar (nuseas). Vomita. Tiene fiebre. Tiene dolor en el vientre que empeora y no se alivia con los medicamentos. Estos sntomas pueden Customer service manager. Solicite ayuda de inmediato. Llame al 911. No espere a ver si los sntomas desaparecen. No conduzca por sus propios medios [employer]. Resumen [name], es normal tener una pequea cantidad de [employer]. Tambin puede tener clicos o hinchazn leves en el abdomen. Si le administraron un sedante durante el procedimiento, no conduzca ni use mquinas hasta que el [employer] indique que es seguro [place]. Un sedante es un medicamento que ayuda a [place]. Obtenga ayuda de inmediato si tiene [employer], tiene ganas de Biochemist, clinical, tiene fiebre o Chief Technology Officer abdominal [place]. Esta informacin no tiene Theme park manager el consejo del mdico. Asegrese de hacerle al mdico cualquier pregunta que tenga. Document Revised: 07/21/2021 Document Reviewed: 07/21/2021 Elsevier Patient Education  2024 Elsevier Inc.Anestesia general en adultos, cuidados posteriores General Anesthesia, Adult, Care After La siguiente informacin ofrece orientacin sobre cmo cuidarse despus del procedimiento. El mdico tambin podr darle instrucciones ms especficas. Comunquese con el mdico si tiene problemas o preguntas. Qu puedo esperar despus del procedimiento? Despus del procedimiento, es normal sentir o tener lo siguiente: Dolor o Social worker de la va intravenosa (i.v.). Nuseas o vmitos. Dolor de Advertising copywriter o ronquera. Dificultad para concentrarse. Fro o escalofros. Debilidad, somnolencia o cansancio (fatiga). Malestar y Tourist information centre manager.  Estos sntomas pueden afectar partes del cuerpo que no estuvieron involucradas en la ciruga. Siga estas indicaciones en su casa: Durante el perodo de [employer] le haya indicado el mdico:  Descanse. No participe en actividades que impliquen posibles cadas o lesiones. No conduzca ni opere maquinaria. No beba alcohol. No tome somnferos ni medicamentos que causen somnolencia. No tome decisiones trascendentes ni firme documentos importantes. No cuide a nios por su cuenta. Indicaciones generales Beba suficiente lquido como para mantener la orina de color amarillo plido. Si tiene apnea del sueo, la [place] y ciertos medicamentos pueden elevar su riesgo de tener problemas respiratorios. Siga las instrucciones del mdico respecto al  uso del dispositivo para dormir: Siempre que duerma, incluso durante las siestas que tome Programmer, multimedia. Mientras tome analgsicos recetados, medicamentos para dormir o medicamentos que producen somnolencia. Retome sus actividades normales segn lo indicado por el mdico. Pregntele al mdico qu actividades son seguras para usted. Use los medicamentos de venta libre y los recetados solamente como se lo haya indicado el mdico. No consuma ningn producto que contenga nicotina o tabaco. Estos productos incluyen cigarrillos, tabaco para Theatre manager y aparatos de vapeo, como los Administrator, Civil Service. Estos pueden retrasar la cicatrizacin de la incisin despus de la ciruga. Si necesita ayuda para dejar de consumir esos productos, consulte al mdico. Comunquese con un mdico si: Tiene nuseas o vmitos que no mejoran con medicamentos. Vomita cada vez que come o bebe. Su dolor no se alivia con medicamentos. No puede orinar o ve [employer]. Tiene una erupcin cutnea. Tiene fiebre. Solicite ayuda de inmediato si: Tiene dificultad para respirar. Siente dolor en el pecho. Vomita sangre. Estos sntomas pueden Customer service manager. Solicite ayuda de  inmediato. Llame al 911. No espere a ver si los sntomas desaparecen. No conduzca por sus propios medios [employer]. Resumen Despus del procedimiento, es comn tener dolor de garganta, ronquera, nuseas, vmitos o sentir debilidad, somnolencia o fatiga. Durante el perodo de tiempo que le haya indicado el mdico, no conduzca ni use [place]. Busque ayuda de inmediato si le cuesta respirar, siente dolor en el pecho o vomita sangre. Estos sntomas pueden Customer service manager. Esta informacin no tiene Theme park manager el consejo del mdico. Asegrese de hacerle al mdico cualquier pregunta que tenga. Document Revised: 03/03/2022 Document Reviewed: 03/03/2022 Elsevier Patient Education  2024 [employer].

## 2024-02-23 ENCOUNTER — Encounter (HOSPITAL_COMMUNITY)
Admission: RE | Admit: 2024-02-23 | Discharge: 2024-02-23 | Disposition: A | Discharge status: Home / Self Care | Payer: Self-pay | Source: Ambulatory Visit | Attending: Gastroenterology | Admitting: Gastroenterology

## 2024-02-23 ENCOUNTER — Encounter: Payer: Self-pay | Admitting: *Deleted

## 2024-02-23 ENCOUNTER — Encounter (HOSPITAL_COMMUNITY): Payer: Self-pay

## 2024-02-23 VITALS — BP 127/91 | HR 81 | Temp 98.0°F | Resp 18 | Ht 66.0 in | Wt 182.8 lb

## 2024-02-23 DIAGNOSIS — D72829 Elevated white blood cell count, unspecified: Secondary | ICD-10-CM | POA: Insufficient documentation

## 2024-02-23 DIAGNOSIS — Z01812 Encounter for preprocedural laboratory examination: Secondary | ICD-10-CM | POA: Insufficient documentation

## 2024-02-23 LAB — CBC WITH DIFFERENTIAL/PLATELET
Abs Immature Granulocytes: 0.04 10*3/uL (ref 0.00–0.07)
Basophils Absolute: 0.1 10*3/uL (ref 0.0–0.1)
Basophils Relative: 1 %
Eosinophils Absolute: 0.3 10*3/uL (ref 0.0–0.5)
Eosinophils Relative: 5 %
HCT: 45.6 % (ref 39.0–52.0)
Hemoglobin: 15.9 g/dL (ref 13.0–17.0)
Immature Granulocytes: 1 %
Lymphocytes Relative: 30 %
Lymphs Abs: 1.9 10*3/uL (ref 0.7–4.0)
MCH: 32.6 pg (ref 26.0–34.0)
MCHC: 34.9 g/dL (ref 30.0–36.0)
MCV: 93.6 fL (ref 80.0–100.0)
Monocytes Absolute: 0.6 10*3/uL (ref 0.1–1.0)
Monocytes Relative: 10 %
Neutro Abs: 3.4 10*3/uL (ref 1.7–7.7)
Neutrophils Relative %: 53 %
Platelets: 240 10*3/uL (ref 150–400)
RBC: 4.87 MIL/uL (ref 4.22–5.81)
RDW: 14 % (ref 11.5–15.5)
WBC: 6.4 10*3/uL (ref 4.0–10.5)
nRBC: 0 % (ref 0.0–0.2)

## 2024-02-27 ENCOUNTER — Ambulatory Visit (HOSPITAL_COMMUNITY)
Admission: RE | Admit: 2024-02-27 | Discharge: 2024-02-27 | Disposition: A | Discharge status: Home / Self Care | Payer: Self-pay | Attending: Gastroenterology | Admitting: Gastroenterology

## 2024-02-27 ENCOUNTER — Ambulatory Visit (HOSPITAL_BASED_OUTPATIENT_CLINIC_OR_DEPARTMENT_OTHER): Payer: Self-pay | Admitting: Anesthesiology

## 2024-02-27 ENCOUNTER — Ambulatory Visit (HOSPITAL_COMMUNITY): Payer: Self-pay | Admitting: Anesthesiology

## 2024-02-27 ENCOUNTER — Encounter (HOSPITAL_COMMUNITY)
Admission: RE | Disposition: A | Discharge status: Home / Self Care | Payer: Self-pay | Source: Home / Self Care | Attending: Gastroenterology

## 2024-02-27 ENCOUNTER — Encounter (HOSPITAL_COMMUNITY): Payer: Self-pay | Admitting: Gastroenterology

## 2024-02-27 ENCOUNTER — Other Ambulatory Visit: Payer: Self-pay

## 2024-02-27 DIAGNOSIS — K5730 Diverticulosis of large intestine without perforation or abscess without bleeding: Secondary | ICD-10-CM

## 2024-02-27 DIAGNOSIS — T184XXA Foreign body in colon, initial encounter: Secondary | ICD-10-CM

## 2024-02-27 DIAGNOSIS — I10 Essential (primary) hypertension: Secondary | ICD-10-CM | POA: Insufficient documentation

## 2024-02-27 DIAGNOSIS — K219 Gastro-esophageal reflux disease without esophagitis: Secondary | ICD-10-CM | POA: Insufficient documentation

## 2024-02-27 DIAGNOSIS — K648 Other hemorrhoids: Secondary | ICD-10-CM

## 2024-02-27 DIAGNOSIS — W44F2XA Rubber band entering into or through a natural orifice, initial encounter: Secondary | ICD-10-CM | POA: Insufficient documentation

## 2024-02-27 DIAGNOSIS — Z87891 Personal history of nicotine dependence: Secondary | ICD-10-CM | POA: Insufficient documentation

## 2024-02-27 DIAGNOSIS — K5900 Constipation, unspecified: Secondary | ICD-10-CM

## 2024-02-27 DIAGNOSIS — K625 Hemorrhage of anus and rectum: Secondary | ICD-10-CM

## 2024-02-27 HISTORY — PX: COLONOSCOPY: SHX5424

## 2024-02-27 LAB — HM COLONOSCOPY

## 2024-02-27 SURGERY — COLONOSCOPY
Anesthesia: General

## 2024-02-27 MED ORDER — DEXMEDETOMIDINE HCL IN NACL 80 MCG/20ML IV SOLN
INTRAVENOUS | Status: AC
  Filled 2024-02-27: qty 20

## 2024-02-27 MED ORDER — HYDROCORTISONE ACETATE 25 MG RE SUPP: 25.0000 mg | Freq: Two times a day (BID) | RECTAL | 0 refills | Status: AC

## 2024-02-27 MED ORDER — LIDOCAINE HCL (PF) 2 % IJ SOLN
INTRAMUSCULAR | Status: DC | PRN
Start: 2024-02-27 — End: 2024-02-27
  Administered 2024-02-27: 50 mg via INTRADERMAL

## 2024-02-27 MED ORDER — DEXMEDETOMIDINE HCL IN NACL 80 MCG/20ML IV SOLN
INTRAVENOUS | Status: DC | PRN
Start: 2024-02-27 — End: 2024-02-27
  Administered 2024-02-27: 12 ug via INTRAVENOUS
  Administered 2024-02-27: 8 ug via INTRAVENOUS

## 2024-02-27 MED ORDER — LACTATED RINGERS IV SOLN: INTRAVENOUS | Status: DC | PRN

## 2024-02-27 MED ORDER — PROPOFOL 500 MG/50ML IV EMUL
INTRAVENOUS | Status: DC | PRN
  Administered 2024-02-27: 150 ug/kg/min via INTRAVENOUS

## 2024-02-27 MED ORDER — PROPOFOL 10 MG/ML IV BOLUS
INTRAVENOUS | Status: DC | PRN
  Administered 2024-02-27 (×2): 50 mg via INTRAVENOUS
  Administered 2024-02-27: 100 mg via INTRAVENOUS

## 2024-02-27 NOTE — Op Note (Signed)
 [facility] Patient Name: Arthur Walker Procedure Date: 02/27/2024 7:20 AM MRN: [id number] Date of Birth: 04/28/1981 Attending MD: Katrinka Blazing , , [id number] CSN: [id number] Age: 43 Admit Type: Outpatient Procedure:                Colonoscopy Indications:              Rectal bleeding Providers:                Katrinka Blazing, Angelica Ran, Dyann Ruddle, [place]                            Wilson Referring MD:              Medicines:                Monitored Anesthesia Care Complications:            No immediate complications. Estimated Blood Loss:     Estimated blood loss: none. Procedure:                Pre-Anesthesia Assessment:                           - Prior to the procedure, a History and Physical                            was performed, and patient medications, allergies                            and sensitivities were reviewed. The patient's                            tolerance of previous anesthesia was reviewed.                           - The risks and benefits of the procedure and the                            sedation options and risks were discussed with the                            patient. All questions were answered and informed                            consent was obtained.                           - ASA Grade Assessment: I - A normal, healthy                            patient.                           After obtaining informed consent, the colonoscope                            was passed under direct vision. Throughout the  procedure, the patient's blood pressure, pulse, and                            oxygen saturations were monitored continuously. The                            [id number] ([id number]) was introduced through the                            anus and advanced to the the terminal ileum. The                            colonoscopy was performed without difficulty. The                            patient tolerated the  procedure well. The quality                            of the bowel preparation was good. Scope In: 7:44:00 AM Scope Out: 8:06:16 AM Scope Withdrawal Time: 0 hours 17 minutes 54 seconds  Total Procedure Duration: 0 hours 22 minutes 16 seconds  Findings:      Protruding internal hemorrhoids were found on perianal exam.      A foreign body was found in the proximal ascending colon. Removal of a       rubber band was accomplished with a Roth net.      Scattered small-mouthed diverticula were found in the sigmoid colon,       descending colon and ascending colon.      Non-bleeding internal hemorrhoids were found during retroflexion. The       hemorrhoids were medium-sized. Impression:               - Hemorrhoids found on perianal exam.                           - Foreign body in the proximal ascending colon.                            Removal was successful.                           - Diverticulosis in the sigmoid colon, in the                            descending colon and in the ascending colon.                           - Non-bleeding internal hemorrhoids. Moderate Sedation:      Per Anesthesia Care Recommendation:           - Discharge patient to home (ambulatory).                           - Resume previous diet.                           - Repeat colonoscopy  in 10 years for screening                            purposes.                           - Can try Anusol suppositories for 7 days.                           - If persisting or worsening rectal bleeding, will                            proceed with hemorrhoidal banding Procedure Code(s):        --- Professional ---                           [phone], Colonoscopy, flexible; with removal of                            foreign body(s) Diagnosis Code(s):        --- Professional ---                           W29.5AOZ, Foreign body in colon, initial encounter                           K64.8, Other hemorrhoids                            K62.5, Hemorrhage of anus and rectum                           K57.30, Diverticulosis of large intestine without                            perforation or abscess without bleeding CPT copyright 2022 [employer]. All rights reserved. The codes documented in this report are preliminary and upon coder review may  be revised to meet current compliance requirements. Katrinka Blazing, MD Katrinka Blazing,  02/27/2024 8:16:46 AM This report has been signed electronically. Number of Addenda: 0

## 2024-02-27 NOTE — Discharge Instructions (Signed)
 You are being discharged to home.  Resume your previous diet.  Your physician has recommended a repeat colonoscopy in 10 years for screening purposes.  Can try Anusol suppositories for 7 days. If persisting or worsening rectal bleeding, will proceed with hemorrhoidal banding

## 2024-02-27 NOTE — Transfer of Care (Signed)
 Immediate Anesthesia Transfer of Care Note  Patient: Arthur Walker  Procedure(s) Performed: COLONOSCOPY  Patient Location: Short Stay  Anesthesia Type:General  Level of Consciousness: drowsy  Airway & Oxygen Therapy: Patient Spontanous Breathing  Post-op Assessment: Report given to RN and Post -op Vital signs reviewed and stable  Post vital signs: Reviewed and stable  Last Vitals:  Vitals Value Taken Time  BP    Temp    Pulse    Resp    SpO2      Last Pain:  Vitals:   02/27/24 0738  TempSrc:   PainSc: 0-No pain         Complications: No notable events documented.

## 2024-02-27 NOTE — Anesthesia Postprocedure Evaluation (Signed)
 Anesthesia Post Note  Patient: Arthur Walker  Procedure(s) Performed: COLONOSCOPY  Patient location during evaluation: Phase II Anesthesia Type: General Level of consciousness: awake Pain management: pain level controlled Vital Signs Assessment: post-procedure vital signs reviewed and stable Respiratory status: spontaneous breathing and respiratory function stable Cardiovascular status: blood pressure returned to baseline and stable Postop Assessment: no headache and no apparent nausea or vomiting Anesthetic complications: no Comments: Late entry   No notable events documented.   Last Vitals:  Vitals:   02/27/24 0809 02/27/24 0815  BP: 97/63 95/62  Pulse: 69 65  Resp: 16 14  Temp:  (!) 36.3 C  SpO2: 98% 98%    Last Pain:  Vitals:   02/27/24 0815  TempSrc: Oral  PainSc:                  Windell Norfolk

## 2024-02-27 NOTE — Anesthesia Preprocedure Evaluation (Signed)
 Anesthesia Evaluation  Patient identified by MRN, date of birth, ID band Patient awake    Reviewed: Allergy & Precautions, H&P , NPO status , Patient's Chart, lab work & pertinent test results, reviewed documented beta blocker date and time   Airway Mallampati: II  TM Distance: >3 FB Neck ROM: full    Dental no notable dental hx.    Pulmonary neg pulmonary ROS, former smoker   Pulmonary exam normal breath sounds clear to auscultation       Cardiovascular Exercise Tolerance: Good hypertension, negative cardio ROS  Rhythm:regular Rate:Normal     Neuro/Psych  PSYCHIATRIC DISORDERS      negative neurological ROS     GI/Hepatic Neg liver ROS,GERD  ,,  Endo/Other  negative endocrine ROS    Renal/GU negative Renal ROS  negative genitourinary   Musculoskeletal   Abdominal   Peds  Hematology negative hematology ROS (+)   Anesthesia Other Findings   Reproductive/Obstetrics negative OB ROS                             Anesthesia Physical Anesthesia Plan  ASA: 2  Anesthesia Plan: General   Post-op Pain Management:    Induction:   PONV Risk Score and Plan: Propofol infusion  Airway Management Planned:   Additional Equipment:   Intra-op Plan:   Post-operative Plan:   Informed Consent: I have reviewed the patients History and Physical, chart, labs and discussed the procedure including the risks, benefits and alternatives for the proposed anesthesia with the patient or authorized representative who has indicated his/her understanding and acceptance.     Dental Advisory Given  Plan Discussed with: CRNA  Anesthesia Plan Comments:        Anesthesia Quick Evaluation

## 2024-02-27 NOTE — Anesthesia Procedure Notes (Signed)
 Date/Time: 02/27/2024 7:37 AM  Performed by: Julian Reil, CRNAPre-anesthesia Checklist: Patient identified, Emergency Drugs available, Suction available and Patient being monitored Patient Re-evaluated:Patient Re-evaluated prior to induction Oxygen Delivery Method: Nasal cannula Induction Type: IV induction Placement Confirmation: positive ETCO2

## 2024-02-27 NOTE — H&P (Signed)
 Arthur Walker is an 43 y.o. male.   Chief Complaint: rectal bleeding HPI: Arthur Walker is a 43 y.o. male presenting today at the request of Muse, Rochelle D., PA-C for GERD, RUQ and LUQ pain, constipation, and rectal bleeding.   He reports having some scant rectal bleeding recently.  No nausea, vomiting, fever, chills.  Has recently some discomfort in his lower abdomen but no abdominal pain.  Past Medical History:  Diagnosis Date   Acid reflux     Past Surgical History:  Procedure Laterality Date   BIOPSY  12/15/2023   Procedure: BIOPSY;  Surgeon: Dolores Frame, MD;  Location: [facility];  Service: Gastroenterology;;   CHOLECYSTECTOMY N/A 11/09/2018   Procedure: LAPAROSCOPIC CHOLECYSTECTOMY;  Surgeon: Franky Macho, MD;  Location: [facility];  Service: General;  Laterality: N/A;   COLONOSCOPY WITH PROPOFOL N/A 12/15/2023   Procedure: COLONOSCOPY WITH PROPOFOL;  Surgeon: Dolores Frame, MD;  Location: [facility];  Service: Gastroenterology;  Laterality: N/A;  1:00 pm, asa 3   ESOPHAGOGASTRODUODENOSCOPY (EGD) WITH PROPOFOL N/A 12/15/2023   Procedure: ESOPHAGOGASTRODUODENOSCOPY (EGD) WITH PROPOFOL;  Surgeon: Dolores Frame, MD;  Location: [facility];  Service: Gastroenterology;  Laterality: N/A;  1:00 pm, asa 3   POLYPECTOMY  12/15/2023   Procedure: POLYPECTOMY;  Surgeon: Dolores Frame, MD;  Location: [facility];  Service: Gastroenterology;;    Family History  Problem Relation Age of Onset   Diabetes Mother    Hypertension Mother    Diabetes Father    Diabetes Maternal Uncle    Hypertension Maternal Uncle    Social History:  reports that he quit smoking about 7 years ago. His smoking use included cigarettes. He started smoking about 24 years ago. He has a 4.3 pack-year smoking history. He has never used smokeless tobacco. He reports current alcohol use of about 2.0 standard drinks of alcohol per week. He reports that he  does not use drugs.  Allergies: No Known Allergies  Medications Prior to Admission  Medication Sig Dispense Refill   atorvastatin (LIPITOR) 10 MG tablet Take 10 mg by mouth daily.     pantoprazole (PROTONIX) 40 MG tablet Take 1 tablet (40 mg total) by mouth daily. 30 tablet 3   HYDROcodone-acetaminophen (NORCO/VICODIN) 5-325 MG tablet Take 1 tablet by mouth every 4 (four) hours as needed. (Patient not taking: Reported on 11/21/2023) 30 tablet 0   hydrocortisone (ANUSOL-HC) 2.5 % rectal cream Place 1 Application rectally 2 (two) times daily. 30 g 1   linaclotide (LINZESS) 145 MCG CAPS capsule Take 1 capsule (145 mcg total) by mouth daily before breakfast. 90 capsule 3   ondansetron (ZOFRAN) 4 MG tablet Take 1 tablet (4 mg total) by mouth every 6 (six) hours. (Patient not taking: Reported on 11/21/2023) 10 tablet 0   sucralfate (CARAFATE) 1 g tablet Take 1 tablet (1 g total) by mouth 4 (four) times daily -  with meals and at bedtime for 14 days. 56 tablet 0    No results found for this or any previous visit (from the past 48 hours). No results found.  Review of Systems  Gastrointestinal:  Positive for blood in stool.  All other systems reviewed and are negative.   Blood pressure (!) 137/109, pulse 62, temperature 98.3 F (36.8 C), temperature source Oral, resp. rate 18, height 5\' 6"  (1.676 m), weight 82.9 kg, SpO2 97%. Physical Exam  GENERAL: The patient is AO x3, in no acute distress. HEENT: Head is normocephalic  and atraumatic. EOMI are intact. Mouth is well hydrated and without lesions. NECK: Supple. No masses LUNGS: Clear to auscultation. No presence of rhonchi/wheezing/rales. Adequate chest expansion HEART: RRR, normal s1 and s2. ABDOMEN: Soft, nontender, no guarding, no peritoneal signs, and nondistended. BS +. No masses. EXTREMITIES: Without any cyanosis, clubbing, rash, lesions or edema. NEUROLOGIC: AOx3, no focal motor deficit. SKIN: no jaundice, no  rashes  Assessment/Plan [name] is a 43 y.o. male presenting today at the request of Muse, Rochelle D., PA-C for GERD, RUQ and LUQ pain, constipation, and rectal bleeding.  Will proceed with colonoscopy.  Dolores Frame, MD 02/27/2024, 7:33 AM

## 2024-02-28 ENCOUNTER — Encounter (INDEPENDENT_AMBULATORY_CARE_PROVIDER_SITE_OTHER): Payer: Self-pay | Admitting: *Deleted

## 2024-02-28 ENCOUNTER — Encounter (HOSPITAL_COMMUNITY): Payer: Self-pay | Admitting: Gastroenterology

## 2024-09-25 ENCOUNTER — Encounter (INDEPENDENT_AMBULATORY_CARE_PROVIDER_SITE_OTHER): Payer: Self-pay | Admitting: Gastroenterology
